# Patient Record
Sex: Male | Born: 1995 | Race: White | Hispanic: No | Marital: Married | Smoking: Never smoker
Health system: Southern US, Community
[De-identification: ages and names within clinical notes are randomized; demographics above are authoritative.]

## PROBLEM LIST (undated history)

## (undated) DIAGNOSIS — K219 Gastro-esophageal reflux disease without esophagitis: Secondary | ICD-10-CM

## (undated) HISTORY — DX: Gastro-esophageal reflux disease without esophagitis: K21.9

## (undated) HISTORY — PX: WISDOM TOOTH EXTRACTION: SHX21

---

## 2017-07-17 NOTE — Patient Instructions (Addendum)
Test(s) ordered today. Your results will be released to MyChart (or called to you) after review, usually within 72hours after test completion. If any changes need to be made, you will be notified at that same time.  All other Health Maintenance issues reviewed.   All recommended immunizations and age-appropriate screenings are up-to-date or discussed.  No immunizations administered today.   Medications reviewed and updated.  Start zantac 150 mg daily for your heartburn.     A referral was ordered for dermatology  Please followup in one year    Gastroesophageal Reflux Disease, Adult Normally, food travels down the esophagus and stays in the stomach to be digested. However, when a person has gastroesophageal reflux disease (GERD), food and stomach acid move back up into the esophagus. When this happens, the esophagus becomes sore and inflamed. Over time, GERD can create small holes (ulcers) in the lining of the esophagus. What are the causes? This condition is caused by a problem with the muscle between the esophagus and the stomach (lower esophageal sphincter, or LES). Normally, the LES muscle closes after food passes through the esophagus to the stomach. When the LES is weakened or abnormal, it does not close properly, and that allows food and stomach acid to go back up into the esophagus. The LES can be weakened by certain dietary substances, medicines, and medical conditions, including:  Tobacco use.  Pregnancy.  Having a hiatal hernia.  Heavy alcohol use.  Certain foods and beverages, such as coffee, chocolate, onions, and peppermint.  What increases the risk? This condition is more likely to develop in:  People who have an increased body weight.  People who have connective tissue disorders.  People who use NSAID medicines.  What are the signs or symptoms? Symptoms of this condition include:  Heartburn.  Difficult or painful swallowing.  The feeling of having a lump  in the throat.  Abitter taste in the mouth.  Bad breath.  Having a large amount of saliva.  Having an upset or bloated stomach.  Belching.  Chest pain.  Shortness of breath or wheezing.  Ongoing (chronic) cough or a night-time cough.  Wearing away of tooth enamel.  Weight loss.  Different conditions can cause chest pain. Make sure to see your health care provider if you experience chest pain. How is this diagnosed? Your health care provider will take a medical history and perform a physical exam. To determine if you have mild or severe GERD, your health care provider may also monitor how you respond to treatment. You may also have other tests, including:  An endoscopy toexamine your stomach and esophagus with a small camera.  A test thatmeasures the acidity level in your esophagus.  A test thatmeasures how much pressure is on your esophagus.  A barium swallow or modified barium swallow to show the shape, size, and functioning of your esophagus.  How is this treated? The goal of treatment is to help relieve your symptoms and to prevent complications. Treatment for this condition may vary depending on how severe your symptoms are. Your health care provider may recommend:  Changes to your diet.  Medicine.  Surgery.  Follow these instructions at home: Diet  Follow a diet as recommended by your health care provider. This may involve avoiding foods and drinks such as: ? Coffee and tea (with or without caffeine). ? Drinks that containalcohol. ? Energy drinks and sports drinks. ? Carbonated drinks or sodas. ? Chocolate and cocoa. ? Peppermint and mint flavorings. ? Garlic  and onions. ? Horseradish. ? Spicy and acidic foods, including peppers, chili powder, curry powder, vinegar, hot sauces, and barbecue sauce. ? Citrus fruit juices and citrus fruits, such as oranges, lemons, and limes. ? Tomato-based foods, such as red sauce, chili, salsa, and pizza with red  sauce. ? Fried and fatty foods, such as donuts, french fries, potato chips, and high-fat dressings. ? High-fat meats, such as hot dogs and fatty cuts of red and white meats, such as rib eye steak, sausage, ham, and bacon. ? High-fat dairy items, such as whole milk, butter, and cream cheese.  Eat small, frequent meals instead of large meals.  Avoid drinking large amounts of liquid with your meals.  Avoid eating meals during the 2-3 hours before bedtime.  Avoid lying down right after you eat.  Do not exercise right after you eat. General instructions  Pay attention to any changes in your symptoms.  Take over-the-counter and prescription medicines only as told by your health care provider. Do not take aspirin, ibuprofen, or other NSAIDs unless your health care provider told you to do so.  Do not use any tobacco products, including cigarettes, chewing tobacco, and e-cigarettes. If you need help quitting, ask your health care provider.  Wear loose-fitting clothing. Do not wear anything tight around your waist that causes pressure on your abdomen.  Raise (elevate) the head of your bed 6 inches (15cm).  Try to reduce your stress, such as with yoga or meditation. If you need help reducing stress, ask your health care provider.  If you are overweight, reduce your weight to an amount that is healthy for you. Ask your health care provider for guidance about a safe weight loss goal.  Keep all follow-up visits as told by your health care provider. This is important. Contact a health care provider if:  You have new symptoms.  You have unexplained weight loss.  You have difficulty swallowing, or it hurts to swallow.  You have wheezing or a persistent cough.  Your symptoms do not improve with treatment.  You have a hoarse voice. Get help right away if:  You have pain in your arms, neck, jaw, teeth, or back.  You feel sweaty, dizzy, or light-headed.  You have chest pain or shortness  of breath.  You vomit and your vomit looks like blood or coffee grounds.  You faint.  Your stool is bloody or black.  You cannot swallow, drink, or eat. This information is not intended to replace advice given to you by your health care provider. Make sure you discuss any questions you have with your health care provider. Document Released: 04/10/2005 Document Revised: 11/29/2015 Document Reviewed: 10/26/2014 Elsevier Interactive Patient Education  2018 [employer]     Health Maintenance, Male A healthy lifestyle and preventive care is important for your health and wellness. Ask your health care provider about what schedule of regular examinations is right for you. What should I know about weight and diet? Eat a Healthy Diet  Eat plenty of vegetables, fruits, whole grains, low-fat dairy products, and lean protein.  Do not eat a lot of foods high in solid fats, added sugars, or salt.  Maintain a Healthy Weight Regular exercise can help you achieve or maintain a healthy weight. You should:  Do at least 150 minutes of exercise each week. The exercise should increase your heart rate and make you sweat (moderate-intensity exercise).  Do strength-training exercises at least twice a week.  Watch Your Levels of Cholesterol and Blood  Lipids  Have your blood tested for lipids and cholesterol every 5 years starting at 22 years of age. If you are at high risk for heart disease, you should start having your blood tested when you are 22 years old. You may need to have your cholesterol levels checked more often if: ? Your lipid or cholesterol levels are high. ? You are older than 22 years of age. ? You are at high risk for heart disease.  What should I know about cancer screening? Many types of cancers can be detected early and may often be prevented. Lung Cancer  You should be screened every year for lung cancer if: ? You are a current smoker who has smoked for at least 30  years. ? You are a former smoker who has quit within the past 15 years.  Talk to your health care provider about your screening options, when you should start screening, and how often you should be screened.  Colorectal Cancer  Routine colorectal cancer screening usually begins at 22 years of age and should be repeated every 5-10 years until you are 22 years old. You may need to be screened more often if early forms of precancerous polyps or small growths are found. Your health care provider may recommend screening at an earlier age if you have risk factors for colon cancer.  Your health care provider may recommend using home test kits to check for hidden blood in the stool.  A small camera at the end of a tube can be used to examine your colon (sigmoidoscopy or colonoscopy). This checks for the earliest forms of colorectal cancer.  Prostate and Testicular Cancer  Depending on your age and overall health, your health care provider may do certain tests to screen for prostate and testicular cancer.  Talk to your health care provider about any symptoms or concerns you have about testicular or prostate cancer.  Skin Cancer  Check your skin from head to toe regularly.  Tell your health care provider about any new moles or changes in moles, especially if: ? There is a change in a mole's size, shape, or color. ? You have a mole that is larger than a pencil eraser.  Always use sunscreen. Apply sunscreen liberally and repeat throughout the day.  Protect yourself by wearing long sleeves, pants, a wide-brimmed hat, and sunglasses when outside.  What should I know about heart disease, diabetes, and high blood pressure?  If you are 68-4 years of age, have your blood pressure checked every 3-5 years. If you are 62 years of age or older, have your blood pressure checked every year. You should have your blood pressure measured twice-once when you are at a hospital or clinic, and once when you are  not at a hospital or clinic. Record the average of the two measurements. To check your blood pressure when you are not at a hospital or clinic, you can use: ? An automated blood pressure machine at a pharmacy. ? A home blood pressure monitor.  Talk to your health care provider about your target blood pressure.  If you are between 105-75 years old, ask your health care provider if you should take aspirin to prevent heart disease.  Have regular diabetes screenings by checking your fasting blood sugar level. ? If you are at a normal weight and have a low risk for diabetes, have this test once every three years after the age of 35. ? If you are overweight and have a high risk  for diabetes, consider being tested at a younger age or more often.  A one-time screening for abdominal aortic aneurysm (AAA) by ultrasound is recommended for men aged 65-75 years who are current or former smokers. What should I know about preventing infection? Hepatitis B If you have a higher risk for hepatitis B, you should be screened for this virus. Talk with your health care provider to find out if you are at risk for hepatitis B infection. Hepatitis C Blood testing is recommended for:  Everyone born from [birth year] through 1965.  Anyone with known risk factors for hepatitis C.  Sexually Transmitted Diseases (STDs)  You should be screened each year for STDs including gonorrhea and chlamydia if: ? You are sexually active and are younger than 22 years of age. ? You are older than 22 years of age and your health care provider tells you that you are at risk for this type of infection. ? Your sexual activity has changed since you were last screened and you are at an increased risk for chlamydia or gonorrhea. Ask your health care provider if you are at risk.  Talk with your health care provider about whether you are at high risk of being infected with HIV. Your health care provider may recommend a prescription medicine to help  prevent HIV infection.  What else can I do?  Schedule regular health, dental, and eye exams.  Stay current with your vaccines (immunizations).  Do not use any tobacco products, such as cigarettes, chewing tobacco, and e-cigarettes. If you need help quitting, ask your health care provider.  Limit alcohol intake to no more than 2 drinks per day. One drink equals 12 ounces of beer, 5 ounces of wine, or 1 ounces of hard liquor.  Do not use street drugs.  Do not share needles.  Ask your health care provider for help if you need support or information about quitting drugs.  Tell your health care provider if you often feel depressed.  Tell your health care provider if you have ever been abused or do not feel safe at home. This information is not intended to replace advice given to you by your health care provider. Make sure you discuss any questions you have with your health care provider. Document Released: 12/28/2007 Document Revised: 02/28/2016 Document Reviewed: 04/04/2015 Elsevier Interactive Patient Education  [employer].

## 2017-07-17 NOTE — Progress Notes (Signed)
Subjective:    Patient ID: Aaron Haynes, male    DOB: 1996-05-24, 22 y.o.   MRN: [id number]  HPI  He is here to establish with a new pcp.  He is here for a physical exam.   GERD:  He has GERD 2-3 times a week.  He takes tums as needed and it works.  He tried to avoid spicy/greasy foods, but eats it sometimes.  He was prescribed medication at one point, but states he never took it.    IBS:  He has sharp abdominal pain at times.  He has increased gas.  He has problems  1-2 times a week.  He had constipation in the past, but now feel his stools are normal.  He states his bowel movements are normal.  He denies any blood in the stool.  Follow-up annually, sooner  Fatigue: his sleep is awful.  He takes metlatonin on occasion.  He is a light sleep.  He wakes a couple of times a night.  He always wakes up about 8 am.  He gets about 7-8 hours a night.  He feels tired when he wakes up.  He feels tired during the day at times sometimes.  He can not and does not take naps.    Medications and allergies reviewed with patient and updated if appropriate.  Patient Active Problem List   Diagnosis Date Noted  . Fatigue 07/18/2017  . IBS (irritable bowel syndrome) 07/18/2017  . GERD (gastroesophageal reflux disease) 07/18/2017  . Pleurisy 07/18/2017  . Plantar wart of right foot 07/18/2017    No current outpatient medications on file prior to visit.   No current facility-administered medications on file prior to visit.     Past Medical History:  Diagnosis Date  . GERD (gastroesophageal reflux disease)     History reviewed. No pertinent surgical history.  Social History   Socioeconomic History  . Marital status: Single    Spouse name: None  . Number of children: None  . Years of education: None  . Highest education level: None  Social Needs  . Financial resource strain: None  . Food insecurity - worry: None  . Food insecurity - inability: None  . Transportation needs - medical:  None  . Transportation needs - non-medical: None  Occupational History  . None  Tobacco Use  . Smoking status: Never Smoker  . Smokeless tobacco: Never Used  Substance and Sexual Activity  . Alcohol use: No    Frequency: Never  . Drug use: No  . Sexual activity: None  Other Topics Concern  . 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 Hypertension Father   . Hyperlipidemia Father   . Heart disease Maternal Grandmother   . Heart disease Maternal Grandfather        heart attack late 78-50's  . Heart disease Paternal Grandfather        heart attack late 40-50's    Review of Systems  Constitutional: Positive for fatigue. Negative for chills and fever.  Eyes: Negative for visual disturbance.  Respiratory: Negative for cough, shortness of breath and wheezing.   Cardiovascular: Negative for chest pain, palpitations and leg swelling.  Gastrointestinal: Positive for abdominal pain (occ with IBS). Negative for blood in stool, constipation and diarrhea.       Gerd  Genitourinary: Negative  for dysuria, frequency and hematuria.  Musculoskeletal: Positive for arthralgias (knee pain) and back pain (occ lower back pain).  Skin: Negative for color change (possible wart right plantar foot) and rash.  Neurological: Positive for headaches (occasional). Negative for dizziness and light-headedness.  Psychiatric/Behavioral: Negative for dysphoric mood. The patient is not nervous/anxious.        Objective:   Vitals:   07/18/17 1454  BP: 104/78  Pulse: 64  Resp: 16  Temp: 98.7 F (37.1 C)  SpO2: 97%   Filed Weights   07/18/17 1454  Weight: 149 lb (67.6 kg)   Body mass index is 21.38 kg/m.  Wt Readings from Last 3 Encounters:  07/18/17 149 lb (67.6 kg)     Physical Exam Constitutional: He appears well-developed and well-nourished. No distress.  HENT:  Head:  Normocephalic and atraumatic.  Right Ear: External ear normal.  Left Ear: External ear normal.  Mouth/Throat: Oropharynx is clear and moist.  Normal ear canals and TM b/l  Eyes: Conjunctivae and EOM are normal.  Neck: Neck supple. No tracheal deviation present. No thyromegaly present.  No carotid bruit  Cardiovascular: Normal rate, regular rhythm, normal heart sounds and intact distal pulses.   No murmur heard. Pulmonary/Chest: Effort normal and breath sounds normal. No respiratory distress. He has no wheezes. He has no rales.  Abdominal: Soft. He exhibits no distension. There is no tenderness.  Genitourinary: deferred  Musculoskeletal: He exhibits no edema.  Lymphadenopathy:   He has no cervical adenopathy.  Skin: Skin is warm and dry. He is not diaphoretic. Warts bottom of foot Psychiatric: He has a normal mood and affect. His behavior is normal.         Assessment & Plan:   Physical exam: Screening blood work  ordered Immunizations   Up to date  Exercise   Stressed regular exercise Weight  Normal BMI Skin   Warts on bottom of foot  - referred to derm Substance abuse   none  See Problem List for Assessment and Plan of chronic medical problems.    Follow-up annually, sooner if heartburn is not better controlled

## 2017-07-18 ENCOUNTER — Ambulatory Visit: Payer: 59 | Admitting: Internal Medicine

## 2017-07-18 ENCOUNTER — Encounter: Payer: Self-pay | Admitting: Internal Medicine

## 2017-07-18 ENCOUNTER — Other Ambulatory Visit (INDEPENDENT_AMBULATORY_CARE_PROVIDER_SITE_OTHER): Payer: 59

## 2017-07-18 VITALS — BP 104/78 | HR 64 | Temp 98.7°F | Resp 16 | Ht 70.0 in | Wt 149.0 lb

## 2017-07-18 DIAGNOSIS — Z0000 Encounter for general adult medical examination without abnormal findings: Secondary | ICD-10-CM

## 2017-07-18 DIAGNOSIS — Z114 Encounter for screening for human immunodeficiency virus [HIV]: Secondary | ICD-10-CM

## 2017-07-18 DIAGNOSIS — Z0001 Encounter for general adult medical examination with abnormal findings: Secondary | ICD-10-CM

## 2017-07-18 DIAGNOSIS — K219 Gastro-esophageal reflux disease without esophagitis: Secondary | ICD-10-CM | POA: Diagnosis not present

## 2017-07-18 DIAGNOSIS — B070 Plantar wart: Secondary | ICD-10-CM

## 2017-07-18 DIAGNOSIS — K589 Irritable bowel syndrome without diarrhea: Secondary | ICD-10-CM | POA: Insufficient documentation

## 2017-07-18 DIAGNOSIS — R5383 Other fatigue: Secondary | ICD-10-CM

## 2017-07-18 DIAGNOSIS — R091 Pleurisy: Secondary | ICD-10-CM | POA: Diagnosis not present

## 2017-07-18 LAB — CBC WITH DIFFERENTIAL/PLATELET
BASOS PCT: 0.4 % (ref 0.0–3.0)
Basophils Absolute: 0 10*3/uL (ref 0.0–0.1)
EOS ABS: 0.3 10*3/uL (ref 0.0–0.7)
EOS PCT: 6 % — AB (ref 0.0–5.0)
HCT: 45.4 % (ref 39.0–52.0)
Hemoglobin: 15.7 g/dL (ref 13.0–17.0)
LYMPHS ABS: 1.5 10*3/uL (ref 0.7–4.0)
Lymphocytes Relative: 29.8 % (ref 12.0–46.0)
MCHC: 34.6 g/dL (ref 30.0–36.0)
MCV: 88.9 fl (ref 78.0–100.0)
MONO ABS: 0.6 10*3/uL (ref 0.1–1.0)
Monocytes Relative: 11.3 % (ref 3.0–12.0)
NEUTROS ABS: 2.6 10*3/uL (ref 1.4–7.7)
Neutrophils Relative %: 52.5 % (ref 43.0–77.0)
PLATELETS: 227 10*3/uL (ref 150.0–400.0)
RBC: 5.1 Mil/uL (ref 4.22–5.81)
RDW: 12.7 % (ref 11.5–15.5)
WBC: 5 10*3/uL (ref 4.0–10.5)

## 2017-07-18 LAB — COMPREHENSIVE METABOLIC PANEL
ALT: 25 U/L (ref 0–53)
AST: 18 U/L (ref 0–37)
Albumin: 4.9 g/dL (ref 3.5–5.2)
Alkaline Phosphatase: 48 U/L (ref 39–117)
BUN: 12 mg/dL (ref 6–23)
CHLORIDE: 103 meq/L (ref 96–112)
CO2: 30 meq/L (ref 19–32)
CREATININE: 0.95 mg/dL (ref 0.40–1.50)
Calcium: 9.3 mg/dL (ref 8.4–10.5)
GFR: 106.33 mL/min (ref >60.00)
GLUCOSE: 91 mg/dL (ref 70–99)
POTASSIUM: 4.7 meq/L (ref 3.5–5.1)
SODIUM: 139 meq/L (ref 135–145)
Total Bilirubin: 2.2 mg/dL — ABNORMAL HIGH (ref 0.2–1.2)
Total Protein: 6.9 g/dL (ref 6.0–8.3)

## 2017-07-18 LAB — LIPID PANEL
CHOL/HDL RATIO: 4
Cholesterol: 161 mg/dL (ref 0–200)
HDL: 43.5 mg/dL (ref >39.00)
LDL CALC: 103 mg/dL — AB (ref 0–99)
NonHDL: 117.77
TRIGLYCERIDES: 75 mg/dL (ref 0.0–149.0)
VLDL: 15 mg/dL (ref 0.0–40.0)

## 2017-07-18 LAB — TSH: TSH: 2.77 u[IU]/mL (ref 0.35–4.50)

## 2017-07-18 MED ORDER — RANITIDINE HCL 150 MG PO TABS: 150.0000 mg | ORAL_TABLET | Freq: Every day | ORAL | 3 refills | Status: DC

## 2017-07-18 MED ORDER — CALCIUM CARBONATE ANTACID 500 MG PO CHEW: 1.0000 | CHEWABLE_TABLET | Freq: Every day | ORAL | Status: AC | PRN

## 2017-07-19 ENCOUNTER — Encounter: Payer: Self-pay | Admitting: Internal Medicine

## 2017-07-19 LAB — HIV ANTIBODY (ROUTINE TESTING W REFLEX): HIV 1&2 Ab, 4th Generation: NONREACTIVE

## 2017-07-19 NOTE — Assessment & Plan Note (Signed)
No active symptoms-this is something he has experienced in the past.  No symptoms for 2 years or so.

## 2017-07-19 NOTE — Assessment & Plan Note (Signed)
Experiencing GERD 2-3 times a week Taking Tums We will start ranitidine nightly-advised ideally once heartburn is well controlled to stop taking this Decrease Tea, carbonated beverages, caffeine Information on GERD diet given If no improvement consider GI referral given age and duration of GERD

## 2017-07-19 NOTE — Assessment & Plan Note (Signed)
We will check basic blood work His sleep quality is not good, but still getting enough sleep Stressed the importance of regular exercise Work on improving sleep quality

## 2017-07-19 NOTE — Assessment & Plan Note (Signed)
Has several on bottom of right foot Will refer to dermatology

## 2017-07-19 NOTE — Assessment & Plan Note (Signed)
Intermittent abdominal pain related to gas No alternating diarrhea or constipation-bowel movements fairly normal No treatment or further evaluation needed

## 2018-07-27 ENCOUNTER — Encounter: Payer: 59 | Admitting: Internal Medicine

## 2018-07-28 NOTE — Patient Instructions (Addendum)
All other Health Maintenance issues reviewed.   All recommended immunizations and age-appropriate screenings are up-to-date or discussed.  No immunizations administered today.   Medications reviewed and updated.  Changes include :   none  Your prescription(s) have been submitted to your pharmacy. Please take as directed and contact our office if you believe you are having problem(s) with the medication(s).  A referral was ordered for GI  Please followup in one year    Health Maintenance, Male A healthy lifestyle and preventive care is important for your health and wellness. Ask your health care provider about what schedule of regular examinations is right for you. What should I know about weight and diet? Eat a Healthy Diet  Eat plenty of vegetables, fruits, whole grains, low-fat dairy products, and lean protein.  Do not eat a lot of foods high in solid fats, added sugars, or salt.  Maintain a Healthy Weight Regular exercise can help you achieve or maintain a healthy weight. You should:  Do at least 150 minutes of exercise each week. The exercise should increase your heart rate and make you sweat (moderate-intensity exercise).  Do strength-training exercises at least twice a week. Watch Your Levels of Cholesterol and Blood Lipids  Have your blood tested for lipids and cholesterol every 5 years starting at 23 years of age. If you are at high risk for heart disease, you should start having your blood tested when you are 23 years old. You may need to have your cholesterol levels checked more often if: ? Your lipid or cholesterol levels are high. ? You are older than 23 years of age. ? You are at high risk for heart disease. What should I know about cancer screening? Many types of cancers can be detected early and may often be prevented. Lung Cancer  You should be screened every year for lung cancer if: ? You are a current smoker who has smoked for at least 30 years. ? You are  a former smoker who has quit within the past 15 years.  Talk to your health care provider about your screening options, when you should start screening, and how often you should be screened. Colorectal Cancer  Routine colorectal cancer screening usually begins at 23 years of age and should be repeated every 5-10 years until you are 23 years old. You may need to be screened more often if early forms of precancerous polyps or small growths are found. Your health care provider may recommend screening at an earlier age if you have risk factors for colon cancer.  Your health care provider may recommend using home test kits to check for hidden blood in the stool.  A small camera at the end of a tube can be used to examine your colon (sigmoidoscopy or colonoscopy). This checks for the earliest forms of colorectal cancer. Prostate and Testicular Cancer  Depending on your age and overall health, your health care provider may do certain tests to screen for prostate and testicular cancer.  Talk to your health care provider about any symptoms or concerns you have about testicular or prostate cancer. Skin Cancer  Check your skin from head to toe regularly.  Tell your health care provider about any new moles or changes in moles, especially if: ? There is a change in a mole's size, shape, or color. ? You have a mole that is larger than a pencil eraser.  Always use sunscreen. Apply sunscreen liberally and repeat throughout the day.  Protect yourself by wearing  long sleeves, pants, a wide-brimmed hat, and sunglasses when outside. What should I know about heart disease, diabetes, and high blood pressure?  If you are 67-83 years of age, have your blood pressure checked every 3-5 years. If you are 32 years of age or older, have your blood pressure checked every year. You should have your blood pressure measured twice-once when you are at a hospital or clinic, and once when you are not at a hospital or  clinic. Record the average of the two measurements. To check your blood pressure when you are not at a hospital or clinic, you can use: ? An automated blood pressure machine at a pharmacy. ? A home blood pressure monitor.  Talk to your health care provider about your target blood pressure.  If you are between 3-48 years old, ask your health care provider if you should take aspirin to prevent heart disease.  Have regular diabetes screenings by checking your fasting blood sugar level. ? If you are at a normal weight and have a low risk for diabetes, have this test once every three years after the age of 77. ? If you are overweight and have a high risk for diabetes, consider being tested at a younger age or more often.  A one-time screening for abdominal aortic aneurysm (AAA) by ultrasound is recommended for men aged 65-75 years who are current or former smokers. What should I know about preventing infection? Hepatitis B If you have a higher risk for hepatitis B, you should be screened for this virus. Talk with your health care provider to find out if you are at risk for hepatitis B infection. Hepatitis C Blood testing is recommended for:  Everyone born from [birth year] through 1965.  Anyone with known risk factors for hepatitis C. Sexually Transmitted Diseases (STDs)  You should be screened each year for STDs including gonorrhea and chlamydia if: ? You are sexually active and are younger than 22 years of age. ? You are older than 23 years of age and your health care provider tells you that you are at risk for this type of infection. ? Your sexual activity has changed since you were last screened and you are at an increased risk for chlamydia or gonorrhea. Ask your health care provider if you are at risk.  Talk with your health care provider about whether you are at high risk of being infected with HIV. Your health care provider may recommend a prescription medicine to help prevent HIV  infection. What else can I do?  Schedule regular health, dental, and eye exams.  Stay current with your vaccines (immunizations).  Do not use any tobacco products, such as cigarettes, chewing tobacco, and e-cigarettes. If you need help quitting, ask your health care provider.  Limit alcohol intake to no more than 2 drinks per day. One drink equals 12 ounces of beer, 5 ounces of wine, or 1 ounces of hard liquor.  Do not use street drugs.  Do not share needles.  Ask your health care provider for help if you need support or information about quitting drugs.  Tell your health care provider if you often feel depressed.  Tell your health care provider if you have ever been abused or do not feel safe at home. This information is not intended to replace advice given to you by your health care provider. Make sure you discuss any questions you have with your health care provider. Document Released: 12/28/2007 Document Revised: 02/28/2016 Document Reviewed: 04/04/2015 Elsevier Interactive  Patient Education  2019 [employer].

## 2018-07-28 NOTE — Progress Notes (Signed)
Subjective:    Patient ID: Aaron Haynes, male    DOB: [birth year], 23 y.o.   MRN: [id number]  HPI He is here for a physical exam.   He is more nauseous with and without eating.  He sometimes wakes up feeling nauseous.  He feels nauseous most days of the week, especially recently.  He has not vomited.  He denies abdominal pain or reflux with the nausea.  He does have reflux sometimes, but that is better.  When nauseous he eats crackers and sometimes it helps and sometimes it makes it worse.  He denies obvious causes. He has occasional painful dysphagia ( more than once a month) and often has to throw up.    He denies changes in his health except the above.    Medications and allergies reviewed with patient and updated if appropriate.  Patient Active Problem List   Diagnosis Date Noted  . Fatigue 07/18/2017  . IBS (irritable bowel syndrome) 07/18/2017  . GERD (gastroesophageal reflux disease) 07/18/2017  . Pleurisy 07/18/2017  . Plantar wart of right foot 07/18/2017    Current Outpatient Medications on File Prior to Visit  Medication Sig Dispense Refill  . calcium carbonate (TUMS) 500 MG chewable tablet Chew 1 tablet (200 mg of elemental calcium total) by mouth daily as needed for indigestion or heartburn.     No current facility-administered medications on file prior to visit.     Past Medical History:  Diagnosis Date  . GERD (gastroesophageal reflux disease)     No past surgical history on file.  Social History   Socioeconomic History  . Marital status: Single    Spouse name: Not on file  . Number of children: Not on file  . Years of education: Not on file  . Highest education level: Not on file  Occupational History  . Not on file  Social Needs  . Financial resource strain: Not on file  . Food insecurity:    Worry: Not on file    Inability: Not on file  . Transportation needs:    Medical: Not on file    Non-medical: Not on file  Tobacco Use  . Smoking  status: Never Smoker  . Smokeless tobacco: Never Used  Substance and Sexual Activity  . Alcohol use: No    Frequency: Never  . Drug use: No  . Sexual activity: Not on file  Lifestyle  . Physical activity:    Days per week: Not on file    Minutes per session: Not on file  . Stress: Not on file  Relationships  . Social connections:    Talks on phone: Not on file    Gets together: Not on file    Attends religious service: Not on file    Active member of club or organization: Not on file    Attends meetings of clubs or organizations: Not on file    Relationship status: Not on file  Other Topics Concern  . Not on file  Social History Narrative   College   Part time bank teller   Exercises 2 times a week on average    Family History  Problem Relation Age of Onset  . Hypertension Father   . Hyperlipidemia Father   . Heart disease Maternal Grandmother   . Heart disease Maternal Grandfather        heart attack late 63-50's  . Heart disease Paternal Grandfather        heart attack late 68-50's  Review of Systems  Constitutional: Negative for chills and fever.  HENT: Positive for trouble swallowing (occ).   Eyes: Negative for visual disturbance.  Respiratory: Negative for cough, shortness of breath and wheezing.   Cardiovascular: Negative for chest pain, palpitations and leg swelling.  Gastrointestinal: Positive for nausea. Negative for abdominal pain, blood in stool, constipation, diarrhea and vomiting.       Occ GERD  Genitourinary: Negative for dysuria and hematuria.  Musculoskeletal: Positive for arthralgias (from sports).  Skin: Negative for rash.  Neurological: Positive for light-headedness (if stands up too quick).  Psychiatric/Behavioral: Negative for dysphoric mood. The patient is not nervous/anxious.        Objective:   Vitals:   07/29/18 0900  BP: 118/78  Pulse: 81  Resp: 16  Temp: 98.2 F (36.8 C)  SpO2: 98%   Filed Weights   07/29/18 0900    Weight: 145 lb 12.8 oz (66.1 kg)   Body mass index is 20.92 kg/m.  Wt Readings from Last 3 Encounters:  07/29/18 145 lb 12.8 oz (66.1 kg)  07/18/17 149 lb (67.6 kg)     Physical Exam Constitutional: He appears well-developed and well-nourished. No distress.  HENT:  Head: Normocephalic and atraumatic.  Right Ear: External ear normal.  Left Ear: External ear normal.  Mouth/Throat: Oropharynx is clear and moist.  Normal ear canals and TM b/l  Eyes: Conjunctivae and EOM are normal.  Neck: Neck supple. No tracheal deviation present. No thyromegaly present.  No carotid bruit  Cardiovascular: Normal rate, regular rhythm, normal heart sounds and intact distal pulses.   No murmur heard. Pulmonary/Chest: Effort normal and breath sounds normal. No respiratory distress. He has no wheezes. He has no rales.  Abdominal: Soft. He exhibits no distension. There is no tenderness.  Genitourinary: deferred  Musculoskeletal: He exhibits no edema.  Lymphadenopathy:   He has no cervical adenopathy.  Skin: Skin is warm and dry. He is not diaphoretic.  Psychiatric: He has a normal mood and affect. His behavior is normal.         Assessment & Plan:   Physical exam: Screening blood work deferred - blood work last year was very good Immunizations     Deferred flu,  tdap up to date Exercise  Active at work Weight   Normal BMI Skin   No concerns Substance abuse   none  See Problem List for Assessment and Plan of chronic medical problems.   FU in one year

## 2018-07-29 ENCOUNTER — Encounter: Payer: Self-pay | Admitting: Gastroenterology

## 2018-07-29 ENCOUNTER — Encounter: Payer: Self-pay | Admitting: Internal Medicine

## 2018-07-29 ENCOUNTER — Ambulatory Visit (INDEPENDENT_AMBULATORY_CARE_PROVIDER_SITE_OTHER): Payer: 59 | Admitting: Internal Medicine

## 2018-07-29 VITALS — BP 118/78 | HR 81 | Temp 98.2°F | Resp 16 | Ht 70.0 in | Wt 145.8 lb

## 2018-07-29 DIAGNOSIS — K219 Gastro-esophageal reflux disease without esophagitis: Secondary | ICD-10-CM

## 2018-07-29 DIAGNOSIS — R110 Nausea: Secondary | ICD-10-CM | POA: Diagnosis not present

## 2018-07-29 DIAGNOSIS — Z0000 Encounter for general adult medical examination without abnormal findings: Secondary | ICD-10-CM

## 2018-07-29 DIAGNOSIS — R1310 Dysphagia, unspecified: Secondary | ICD-10-CM | POA: Diagnosis not present

## 2018-07-29 NOTE — Assessment & Plan Note (Signed)
Frequent nausea and intermittent dysphagia - referred to GI Has h/o GERD which he feels is controlled Nausea and dysphagia very possibly from GERD Deferred any medication  Will see what GI thinks

## 2018-07-29 NOTE — Assessment & Plan Note (Signed)
Referred to GI

## 2018-07-29 NOTE — Assessment & Plan Note (Signed)
Feels his gerd is controlled and not related to his nausea, but with nausea and dysphagia GERD is a likely cause Deferred medication - prescribed zantac last year and it did help GERD but he did not take it daily and then he states it made gerd worse Will see what GI says

## 2018-08-26 ENCOUNTER — Ambulatory Visit: Payer: 59 | Admitting: Gastroenterology

## 2018-08-26 ENCOUNTER — Encounter: Payer: Self-pay | Admitting: Gastroenterology

## 2018-08-26 VITALS — BP 92/62 | HR 64 | Ht 69.5 in | Wt 142.0 lb

## 2018-08-26 DIAGNOSIS — R1310 Dysphagia, unspecified: Secondary | ICD-10-CM

## 2018-08-26 DIAGNOSIS — K219 Gastro-esophageal reflux disease without esophagitis: Secondary | ICD-10-CM

## 2018-08-26 MED ORDER — OMEPRAZOLE 40 MG PO CPDR: 40.0000 mg | DELAYED_RELEASE_CAPSULE | Freq: Every day | ORAL | 4 refills | Status: DC

## 2018-08-26 NOTE — Progress Notes (Signed)
Referring Provider: Pincus SanesBurns, Stacy J, MD Primary Care Physician:  Pincus SanesBurns, Stacy J, MD   Reason for Consultation:  GERD, dysphagia   IMPRESSION:  Reflux x many years Dysphagia becoming progressively worse Nausea x weeks to months Ibuprofen use for arthritic pain  All of his symptoms may be due to incompletely treated reflux with an associated GERD related dysmotility.  However, given his age and his symptoms of dysphasia eosinophilic esophagitis should be excluded.  The differential also includes peptic ulcer disease, H. pylori, gastritis, and given the chronicity, functional etiologies must also be considered.  Given this differential I am recommending a trial of daily proton pump inhibitor therapy.  Hopefully this will provide improved symptom relief beyond the Zantac PRN.  I am also recommending an upper endoscopy with esophageal biopsies to exclude eosinophilic esophagitis.  PLAN: Omeprazole 40 mg daily Reflux lifestyle brochure provided Continue to use Tums PRN EGD with esophageal biopsies  I consented the patient at the bedside today discussing the risks, benefits, and alternatives to endoscopic evaluation. In particular, we discussed the risks that include, but are not limited to, reaction to medication, cardiopulmonary compromise, bleeding requiring blood transfusion, aspiration resulting in pneumonia, perforation requiring surgery, lack of diagnosis, severe illness requiring hospitalization, and even death. We reviewed the risk of missed lesion including polyps or even cancer. The patient acknowledges these risks and asks that we proceed.   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 Lawerance BachBurns for further evaluation of GERD, occasional dysphasia, and frequent nausea.  History is obtained through the patient and review of his electronic health record.  Reflux since middle school. Exacerbated by spicy foods and carbonated  and caffeinated beverages. Reviewed refux with Dr. Lawerance BachBurns last year. Trial of Zantac controlled the symptoms at that time. But, when he forgot to take the medication, which was frequently, the symptoms were worse. He felt better not taking Zantac and has been using Tums for control since that time. Currently needs Tums at least weekly.   Intermittent dysphagia with food lodging that is localized to the xiphoid process has been occurring over the last 3-4 years. Associated odynophagia. Has required induced regurgitation. Happens at least once or twice weekly.  Concerned that the symptoms are worsening.  No dysphonia, no sore throat, no abdominal pain.  Weight is stable.  No other associated symptoms. No identified exacerbating or relieving features.   Finds that he gets "too sick on his stomach too easy." Feels some nausea when he hasn't eaten. However, with eating the nausea gets worse.  Occasionally wakes up nauseated. No associated vomiting. No abdominal pain.  Some improvement over the last couple of weeks. He feels his reflux is controlled and not related to his nausea.   Uses ibuprofen. Recently used ibuprofen for pharyngitis. Will occasionally use it for back pain related to sports injuries from high school. No other NSAIDs.   Sister has ulcerative colitis. Multiple family members with GI problems. Mother with stomach problems and swallowing difficulties.   Past Medical History:  Diagnosis Date  . GERD (gastroesophageal reflux disease)     Past Surgical History:  Procedure Laterality Date  . WISDOM TOOTH EXTRACTION      Current Outpatient Medications  Medication Sig Dispense Refill  . calcium carbonate (TUMS) 500 MG chewable tablet Chew 1 tablet (200 mg of elemental calcium total) by mouth daily as needed for indigestion or heartburn.    . clindamycin (CLINDAGEL) 1 %  gel Apply 1 application topically daily.     No current facility-administered medications for this visit.      Allergies as of 08/26/2018  . (No Known Allergies)    Family History  Problem Relation Age of Onset  . Hypertension Father   . Hyperlipidemia Father   . Heart disease Maternal Grandmother   . Diabetes Maternal Grandmother   . Heart disease Maternal Grandfather        heart attack late 1840-50's  . Heart disease Paternal Grandfather        heart attack late 2540-50's  . Irritable bowel syndrome Mother   . Ulcerative colitis Sister     Social History   Socioeconomic History  . Marital status: Single    Spouse name: Not on file  . Number of children: 0  . Years of education: Not on file  . Highest education level: Not on file  Occupational History  . Occupation: church  Social Needs  . Financial resource strain: Not on file  . Food insecurity:    Worry: Not on file    Inability: Not on file  . Transportation needs:    Medical: Not on file    Non-medical: Not on file  Tobacco Use  . Smoking status: Never Smoker  . Smokeless tobacco: Never Used  Substance and Sexual Activity  . Alcohol use: No    Frequency: Never  . Drug use: No  . Sexual activity: Not on file  Lifestyle  . Physical activity:    Days per week: Not on file    Minutes per session: Not on file  . Stress: Not on file  Relationships  . Social connections:    Talks on phone: Not on file    Gets together: Not on file    Attends religious service: Not on file    Active member of club or organization: Not on file    Attends meetings of clubs or organizations: Not on file    Relationship status: Not on file  . Intimate partner violence:    Fear of current or ex partner: Not on file    Emotionally abused: Not on file    Physically abused: Not on file    Forced sexual activity: Not on file  Other Topics Concern  . Not on file  Social History Narrative   College   Part time bank teller   Exercises 2 times a week on average    Review of Systems: 12 system ROS is negative except as noted above  except for sports related arthralgias and lightheadedness if he stands up too quickly.  Filed Weights   08/26/18 0904  Weight: 142 lb (64.4 kg)    Physical Exam: Vital signs were reviewed. General:   Alert, well-nourished, pleasant and cooperative in NAD Head:  Normocephalic and atraumatic. Eyes:  Sclera clear, no icterus.   Conjunctiva pink. Mouth:  No deformity or lesions.   Neck:  Supple; no thyromegaly. Lungs:  Clear throughout to auscultation.   No wheezes. Heart:  Regular rate and rhythm; no murmurs Abdomen:  Soft, thin, nontender, normal bowel sounds. No rebound or guarding. No hepatosplenomegaly LAD: No inguinal or umbilical lymphadenopathy Rectal:  Deferred  Msk:  Symmetrical without gross deformities. Extremities:  No gross deformities or edema. Neurologic:  Alert and  oriented x4;  grossly nonfocal Skin:  No rash or bruise. Psych:  Alert and cooperative. Normal mood and affect.   Shaneya Taketa L. Orvan FalconerBeavers, MD, MPH [facility] 08/26/2018, 9:27 AM

## 2018-08-26 NOTE — Patient Instructions (Addendum)
If you are age 23 or older, your body mass index should be between 23-30. Your Body mass index is 20.67 kg/m. If this is out of the aforementioned range listed, please consider follow up with your Primary Care Provider.  If you are age 17 or younger, your body mass index should be between 19-25. Your Body mass index is 20.67 kg/m. If this is out of the aformentioned range listed, please consider follow up with your Primary Care Provider.   You have been scheduled for an endoscopy. Please follow written instructions given to you at your visit today. If you use inhalers (even only as needed), please bring them with you on the day of your procedure. Your physician has requested that you go to www.startemmi.com and enter the access code given to you at your visit today. This web site gives a general overview about your procedure. However, you should still follow specific instructions given to you by our office regarding your preparation for the procedure.  We have sent the following medications to your pharmacy for you to pick up at your convenience: Omeprazole 40mg  Take once daily  Continue to use TUMS as needed.  We are giving you a handout regarding Reflux today.    Thank you for entrusting me with your care and for choosing [facility], Dr. Tressia Danas

## 2018-08-27 ENCOUNTER — Encounter: Payer: Self-pay | Admitting: Gastroenterology

## 2018-08-27 ENCOUNTER — Ambulatory Visit (AMBULATORY_SURGERY_CENTER): Payer: 59 | Admitting: Gastroenterology

## 2018-08-27 VITALS — BP 120/88 | HR 61 | Temp 99.1°F | Resp 12 | Ht 69.0 in | Wt 142.0 lb

## 2018-08-27 DIAGNOSIS — K219 Gastro-esophageal reflux disease without esophagitis: Secondary | ICD-10-CM

## 2018-08-27 DIAGNOSIS — K200 Eosinophilic esophagitis: Secondary | ICD-10-CM

## 2018-08-27 DIAGNOSIS — K2970 Gastritis, unspecified, without bleeding: Secondary | ICD-10-CM | POA: Diagnosis not present

## 2018-08-27 MED ORDER — SODIUM CHLORIDE 0.9 % IV SOLN: 500.0000 mL | Freq: Once | INTRAVENOUS | Status: DC

## 2018-08-27 NOTE — Progress Notes (Signed)
PT taken to PACU. Monitors in place. VSS. Report given to RN. 

## 2018-08-27 NOTE — Progress Notes (Signed)
Called to room to assist during endoscopic procedure.  Patient ID and intended procedure confirmed with present staff. Received instructions for my participation in the procedure from the performing physician.  

## 2018-08-27 NOTE — Patient Instructions (Signed)
YOU HAD AN ENDOSCOPIC PROCEDURE TODAY AT THE [facility]:   Refer to the procedure report that was given to you for any specific questions about what was found during the examination.  If the procedure report does not answer your questions, please call your gastroenterologist to clarify.  If you requested that your care partner not be given the details of your procedure findings, then the procedure report has been included in a sealed envelope for you to review at your convenience later.  YOU SHOULD EXPECT: Some feelings of bloating in the abdomen. Passage of more gas than usual.  Walking can help get rid of the air that was put into your GI tract during the procedure and reduce the bloating. If you had a lower endoscopy (such as a colonoscopy or flexible sigmoidoscopy) you may notice spotting of blood in your stool or on the toilet paper. If you underwent a bowel prep for your procedure, you may not have a normal bowel movement for a few days.  Please Note:  You might notice some irritation and congestion in your nose or some drainage.  This is from the oxygen used during your procedure.  There is no need for concern and it should clear up in a day or so.  SYMPTOMS TO REPORT IMMEDIATELY:   Following upper endoscopy (EGD)  Vomiting of blood or coffee ground material  New chest pain or pain under the shoulder blades  Painful or persistently difficult swallowing  New shortness of breath  Fever of 100F or higher  Black, tarry-looking stools  For urgent or emergent issues, a gastroenterologist can be reached at any hour by calling [phone].   DIET:  We do recommend a small meal at first, but then you may proceed to your regular diet.  Drink plenty of fluids but you should avoid alcoholic beverages for 24 hours.  ACTIVITY:  You should plan to take it easy for the rest of today and you should NOT DRIVE or use heavy machinery until tomorrow (because of the sedation medicines used  during the test).    FOLLOW UP: Our staff will call the number listed on your records the next business day following your procedure to check on you and address any questions or concerns that you may have regarding the information given to you following your procedure. If we do not reach you, we will leave a message.  However, if you are feeling well and you are not experiencing any problems, there is no need to return our call.  We will assume that you have returned to your regular daily activities without incident.  If any biopsies were taken you will be contacted by phone or by letter within the next 1-3 weeks.  Please call us at [phone] if you have not heard about the biopsies in 3 weeks.    SIGNATURES/CONFIDENTIALITY: You and/or your care partner have signed paperwork which will be entered into your electronic medical record.  These signatures attest to the fact that that the information above on your After Visit Summary has been reviewed and is understood.  Full responsibility of the confidentiality of this discharge information lies with you and/or your care-partner. 

## 2018-08-27 NOTE — Op Note (Signed)
[facility] Patient Name: Aaron Haynes Procedure Date: 08/27/2018 2:52 PM MRN: [id number] Endoscopist: Tressia Danas MD, MD Age: 23 Referring MD:  Date of Birth: [birth year] Gender: Male Account #: [id number] Procedure:                Upper GI endoscopy Indications:              Dysphagia, GERD Medicines:                See the Anesthesia note for documentation of the                            administered medications Procedure:                Pre-Anesthesia Assessment:                           - Prior to the procedure, a History and Physical                            was performed, and patient medications and                            allergies were reviewed. The patient's tolerance of                            previous anesthesia was also reviewed. The risks                            and benefits of the procedure and the sedation                            options and risks were discussed with the patient.                            All questions were answered, and informed consent                            was obtained. Prior Anticoagulants: The patient has                            taken no previous anticoagulant or antiplatelet                            agents. ASA Grade Assessment: I - A normal, healthy                            patient. After reviewing the risks and benefits,                            the patient was deemed in satisfactory condition to                            undergo the procedure.  After obtaining informed consent, the endoscope was                            passed under direct vision. Throughout the                            procedure, the patient's blood pressure, pulse, and                            oxygen saturations were monitored continuously. The                            Model GIF-HQ190 [id number]) scope was introduced                            through the mouth, and advanced to the third part                             of duodenum. The upper GI endoscopy was                            accomplished without difficulty. The patient                            tolerated the procedure well. Scope In: Scope Out: Findings:                 No endoscopic abnormality was evident in the                            esophagus to explain the patient's complaint of                            dysphagia. Biopsies were obtained from the proximal                            and distal esophagus with cold forceps for                            histology of suspected eosinophilic esophagitis.                           The entire examined stomach was normal. Biopsies                            were taken with a cold forceps for histology.                           The examined duodenum was normal.                           The cardia and gastric fundus were normal on                            retroflexion.  The exam was otherwise without abnormality. Complications:            No immediate complications. Estimated blood loss:                            Minimal. Estimated Blood Loss:     Estimated blood loss was minimal. Impression:               - No endoscopic esophageal abnormality to explain                            patient's dysphagia. Biopsied to exclude                            eosinophilic esophagitis.                           - Normal stomach. Biopsied.                           - Normal examined duodenum.                           - The examination was otherwise normal. Recommendation:           - Patient has a contact number available for                            emergencies. The signs and symptoms of potential                            delayed complications were discussed with the                            patient. Return to normal activities tomorrow.                            Written discharge instructions were provided to the                             patient.                           - Resume regular diet.                           - Continue present medications. Take omeprazole 40                            mg daily for at least 8 weeks.                           - Await pathology results.                           - No repeat upper endoscopy planned at this time. Tressia Danas MD, MD 08/27/2018 3:20:58 PM This report has been signed electronically.

## 2018-08-28 ENCOUNTER — Telehealth: Payer: Self-pay

## 2018-08-28 NOTE — Telephone Encounter (Signed)
  Follow up Call-  Call back number 08/27/2018  Post procedure Call Back phone  # [phone]  Permission to leave phone message Yes     Patient questions:  Do you have a fever, pain , or abdominal swelling? No. Pain Score  0 *  Have you tolerated food without any problems? Yes.    Have you been able to return to your normal activities? Yes.    Do you have any questions about your discharge instructions: Diet   No. Medications  No. Follow up visit  No.  Do you have questions or concerns about your Care? No.  Actions: * If pain score is 4 or above: No action needed, pain <4.  No problems noted per pt. maw

## 2018-08-28 NOTE — Telephone Encounter (Signed)
NO ANSWER, MESSAGE LEFT FOR PATIENT. 

## 2018-09-18 ENCOUNTER — Other Ambulatory Visit: Payer: Self-pay

## 2018-09-18 MED ORDER — OMEPRAZOLE 40 MG PO CPDR: 40.0000 mg | DELAYED_RELEASE_CAPSULE | Freq: Two times a day (BID) | ORAL | 1 refills | Status: DC

## 2018-10-28 ENCOUNTER — Ambulatory Visit: Payer: 59 | Admitting: Gastroenterology

## 2019-04-05 ENCOUNTER — Encounter: Payer: Self-pay | Admitting: Internal Medicine

## 2019-05-24 ENCOUNTER — Other Ambulatory Visit: Payer: Self-pay

## 2019-05-24 DIAGNOSIS — Z20822 Contact with and (suspected) exposure to covid-19: Secondary | ICD-10-CM

## 2019-05-25 LAB — NOVEL CORONAVIRUS, NAA: SARS-CoV-2, NAA: NOT DETECTED

## 2019-11-03 ENCOUNTER — Other Ambulatory Visit: Payer: Self-pay | Admitting: Emergency Medicine

## 2019-11-03 MED ORDER — OMEPRAZOLE 40 MG PO CPDR: 40.0000 mg | DELAYED_RELEASE_CAPSULE | Freq: Two times a day (BID) | ORAL | 0 refills | Status: DC

## 2020-02-03 ENCOUNTER — Ambulatory Visit: Payer: 59 | Admitting: Internal Medicine

## 2020-02-03 ENCOUNTER — Other Ambulatory Visit: Payer: Self-pay

## 2020-02-03 ENCOUNTER — Encounter: Payer: Self-pay | Admitting: Internal Medicine

## 2020-02-03 VITALS — BP 110/70 | HR 79 | Temp 98.2°F | Ht 69.0 in | Wt 157.0 lb

## 2020-02-03 DIAGNOSIS — K219 Gastro-esophageal reflux disease without esophagitis: Secondary | ICD-10-CM | POA: Diagnosis not present

## 2020-02-03 DIAGNOSIS — R1310 Dysphagia, unspecified: Secondary | ICD-10-CM | POA: Diagnosis not present

## 2020-02-03 DIAGNOSIS — R110 Nausea: Secondary | ICD-10-CM

## 2020-02-03 DIAGNOSIS — K589 Irritable bowel syndrome without diarrhea: Secondary | ICD-10-CM

## 2020-02-03 MED ORDER — NITROGLYCERIN 0.4 MG SL SUBL: 0.4000 mg | SUBLINGUAL_TABLET | SUBLINGUAL | 3 refills | Status: DC | PRN

## 2020-02-03 MED ORDER — OMEPRAZOLE 40 MG PO CPDR: 40.0000 mg | DELAYED_RELEASE_CAPSULE | Freq: Two times a day (BID) | ORAL | 2 refills | Status: DC

## 2020-02-03 NOTE — Progress Notes (Signed)
Subjective:    Patient ID: Aaron Haynes, male    DOB: 1995-10-03, 24 y.o.   MRN: [id number]  HPI  Here with long hx of reflux, has seen GI with EGD last yr Dr Tarri Glenn with related dysphagia that was normal, then sent to Allergy for testing and only few allergies to mold and pollen, and not felt to cause problem with esophagus.  Conts to have ongoing symptoms of reflux that is controlled pretty much with med but dysphagia still persisting, mild.  Most recently with getting worse again will have solid food get stuck, severe pain, and unable to pass, where he had to go to [facility] to vomit. Admits to not always taking the PM dose of omeprazole 40 mg. No wt loss, No fever, aspiration.  Pt denies other chest pain, increased sob or doe, wheezing, orthopnea, PND, increased LE swelling, palpitations, dizziness or syncope. Pt denies new neurological symptoms such as new headache, or facial or extremity weakness or numbness   Pt denies polydipsia, polyuria Past Medical History:  Diagnosis Date  . GERD (gastroesophageal reflux disease)    Past Surgical History:  Procedure Laterality Date  . WISDOM TOOTH EXTRACTION      reports that he has never smoked. He has never used smokeless tobacco. He reports that he does not drink alcohol and does not use drugs. family history includes Diabetes in his maternal grandmother; Heart disease in his maternal grandfather, maternal grandmother, and paternal grandfather; Hyperlipidemia in his father; Hypertension in his father; Irritable bowel syndrome in his mother; Ulcerative colitis in his sister. No Known Allergies Current Outpatient Medications on File Prior to Visit  Medication Sig Dispense Refill  . calcium carbonate (TUMS) 500 MG chewable tablet Chew 1 tablet (200 mg of elemental calcium total) by mouth daily as needed for indigestion or heartburn.    Marland Kitchen COVID-19 Specimen Collection KIT See admin instructions.     No current facility-administered medications on file  prior to visit.   Review of Systems All otherwise neg per pt    Objective:   Physical Exam BP 110/70 (BP Location: Left Arm, Patient Position: Sitting, Cuff Size: Large)   Pulse 79   Temp 98.2 F (36.8 C) (Oral)   Ht '5\' 9"'$  (1.753 m)   Wt 157 lb (71.2 kg)   SpO2 97%   BMI 23.18 kg/m  VS noted,  Constitutional: Pt appears in NAD HENT: Head: NCAT.  Right Ear: External ear normal.  Left Ear: External ear normal.  Eyes: . Pupils are equal, round, and reactive to light. Conjunctivae and EOM are normal Nose: without d/c or deformity Neck: Neck supple. Gross normal ROM Cardiovascular: Normal rate and regular rhythm.   Pulmonary/Chest: Effort normal and breath sounds without rales or wheezing.  Abd:  Soft, NT, ND, + BS, no organomegaly Neurological: Pt is alert. At baseline orientation, motor grossly intact Skin: Skin is warm. No rashes, other new lesions, no LE edema Psychiatric: Pt behavior is normal without agitation  All otherwise neg per pt Lab Results  Component Value Date   WBC 5.0 07/18/2017   HGB 15.7 07/18/2017   HCT 45.4 07/18/2017   PLT 227.0 07/18/2017   GLUCOSE 91 07/18/2017   CHOL 161 07/18/2017   TRIG 75.0 07/18/2017   HDL 43.50 07/18/2017   LDLCALC 103 (H) 07/18/2017   ALT 25 07/18/2017   AST 18 07/18/2017   NA 139 07/18/2017   K 4.7 07/18/2017   CL 103 07/18/2017   CREATININE 0.95 07/18/2017  BUN 12 07/18/2017   CO2 30 07/18/2017   TSH 2.77 07/18/2017      Assessment & Plan:

## 2020-02-03 NOTE — Patient Instructions (Addendum)
Please take the omeprazole 40 mg twice per day every day  Ok to try the Nitroglycerin for severe esophagus pain if this recurs  Please continue all other medications as before, and refills have been done if requested.  Please have the pharmacy call with any other refills you may need.  Please keep your appointments with your specialists as you may have planned  You will be contacted regarding the referral for: Barium Swallow  We may need to consider further follow up with Dr Olam Idler

## 2020-02-06 ENCOUNTER — Encounter: Payer: Self-pay | Admitting: Internal Medicine

## 2020-02-06 NOTE — Assessment & Plan Note (Signed)
For increased PPI to bid

## 2020-02-06 NOTE — Assessment & Plan Note (Signed)
O/w stable, cont same tx 

## 2020-02-06 NOTE — Assessment & Plan Note (Signed)
Ok for antiemetic prn

## 2020-02-06 NOTE — Assessment & Plan Note (Addendum)
Ok for barium swallow r/o esophageal dysmotility, for ntg sl prn esophageal pain, and consider f/u with GI  I spent 31 minutes in preparing to see the patient by review of recent labs, imaging and procedures, obtaining and reviewing separately obtained history, communicating with the patient and family or caregiver, ordering medications, tests or procedures, and documenting clinical information in the EHR including the differential Dx, treatment, and any further evaluation and other management of dysphagia, nasuea, gerd, ibs

## 2020-02-16 ENCOUNTER — Other Ambulatory Visit: Payer: 59

## 2020-03-01 ENCOUNTER — Ambulatory Visit
Admission: RE | Admit: 2020-03-01 | Discharge: 2020-03-01 | Disposition: A | Discharge status: Home / Self Care | Payer: 59 | Source: Ambulatory Visit | Attending: Internal Medicine | Admitting: Internal Medicine

## 2020-03-01 ENCOUNTER — Encounter: Payer: Self-pay | Admitting: Internal Medicine

## 2020-03-01 DIAGNOSIS — R110 Nausea: Secondary | ICD-10-CM

## 2020-03-01 DIAGNOSIS — R1310 Dysphagia, unspecified: Secondary | ICD-10-CM

## 2020-03-01 DIAGNOSIS — K219 Gastro-esophageal reflux disease without esophagitis: Secondary | ICD-10-CM

## 2020-06-20 ENCOUNTER — Telehealth (INDEPENDENT_AMBULATORY_CARE_PROVIDER_SITE_OTHER): Payer: 59 | Admitting: Internal Medicine

## 2020-06-20 ENCOUNTER — Encounter: Payer: Self-pay | Admitting: Internal Medicine

## 2020-06-20 ENCOUNTER — Other Ambulatory Visit: Payer: Self-pay

## 2020-06-20 DIAGNOSIS — J069 Acute upper respiratory infection, unspecified: Secondary | ICD-10-CM | POA: Insufficient documentation

## 2020-06-20 MED ORDER — AZITHROMYCIN 250 MG PO TABS: ORAL_TABLET | ORAL | 0 refills | Status: DC

## 2020-06-20 NOTE — Assessment & Plan Note (Signed)
Acute Worsening chest symptoms with productive cough and low grade fevers Concern for bacterial cause, low likelihood of covid Start zpak Continue otc cold medications Rest, fluids Call if no improvement

## 2020-06-20 NOTE — Progress Notes (Signed)
Virtual Visit via Video Note  I connected with Aaron Haynes on 06/20/20 at  9:30 AM EST by a video enabled telemedicine application and verified that I am speaking with the correct person using two identifiers.   I discussed the limitations of evaluation and management by telemedicine and the availability of in person appointments. The patient expressed understanding and agreed to proceed.  Present for the visit:  Myself, Dr Cheryll Cockayne, Aaron Haynes.  The patient is currently at home and I am in the office.    No referring provider.    History of Present Illness: He is here for an acute visit for cold symptoms.  His symptoms started one week ago.  He is experiencing ST, nasal congestion, mild sinus pressure, low grade fever, chills, cough that is productive and headaches.  His cough is worse in morning and evening.  His chest symptoms are getting worse  He has tried taking claritin, mucinex   He had covid in august.    Review of Systems  Constitutional: Positive for fever (low grade). Negative for chills.  HENT: Positive for congestion and sinus pain (mild). Negative for ear pain. Sore throat: better.        Hoarseness  Respiratory: Positive for cough and sputum production. Negative for shortness of breath and wheezing.   Gastrointestinal: Negative for diarrhea and nausea.  Musculoskeletal: Negative for myalgias.  Neurological: Positive for headaches. Negative for dizziness.      Social History   Socioeconomic History  . Marital status: Married    Spouse name: Not on file  . Number of children: 0  . Years of education: Not on file  . Highest education level: Not on file  Occupational History  . Occupation: church  Tobacco Use  . Smoking status: Never Smoker  . Smokeless tobacco: Never Used  Vaping Use  . Vaping Use: Never used  Substance and Sexual Activity  . Alcohol use: No  . Drug use: No  . Sexual activity: Not on file  Other Topics Concern  . Not on file   Social History Narrative   College   Part time bank teller   Exercises 2 times a week on average   Social Determinants of Health   Financial Resource Strain:   . Difficulty of Paying Living Expenses: Not on file  Food Insecurity:   . Worried About Programme researcher, broadcasting/film/video in the Last Year: Not on file  . Ran Out of Food in the Last Year: Not on file  Transportation Needs:   . Lack of Transportation (Medical): Not on file  . Lack of Transportation (Non-Medical): Not on file  Physical Activity:   . Days of Exercise per Week: Not on file  . Minutes of Exercise per Session: Not on file  Stress:   . Feeling of Stress : Not on file  Social Connections:   . Frequency of Communication with Friends and Family: Not on file  . Frequency of Social Gatherings with Friends and Family: Not on file  . Attends Religious Services: Not on file  . Active Member of Clubs or Organizations: Not on file  . Attends Banker Meetings: Not on file  . Marital Status: Not on file     Observations/Objective: Appears well in NAD Sounds hoarse and congested Breathing normal Skin appears warm and dry  Assessment and Plan:  See Problem List for Assessment and Plan of chronic medical problems.   Follow Up Instructions:    I discussed the  assessment and treatment plan with the patient. The patient was provided an opportunity to ask questions and all were answered. The patient agreed with the plan and demonstrated an understanding of the instructions.   The patient was advised to call back or seek an in-person evaluation if the symptoms worsen or if the condition fails to improve as anticipated.    Binnie Rail, MD

## 2021-04-18 IMAGING — RF DG ESOPHAGUS
4 series · 15 of 22 positions shown · non-contrast
Comparison: None.

CLINICAL DATA: Recurrent dysphagia with vomiting.

EXAM:
ESOPHOGRAM / BARIUM SWALLOW / BARIUM TABLET STUDY
TECHNIQUE: Combined double contrast and single contrast examination performed
using effervescent crystals, thick barium liquid, and thin barium
liquid. The patient was observed with fluoroscopy swallowing a 13 mm
barium sulphate tablet.
FLUOROSCOPY TIME:  Fluoroscopy Time:  1 minutes 0 seconds
Radiation Exposure Index (if provided by the fluoroscopic device):
75 mGy
Number of Acquired Spot Images: 8

[Series 1: sequence · 0.31mm/px · 2 of 4 frames shown (1 of 2)]
[frame 1/4]
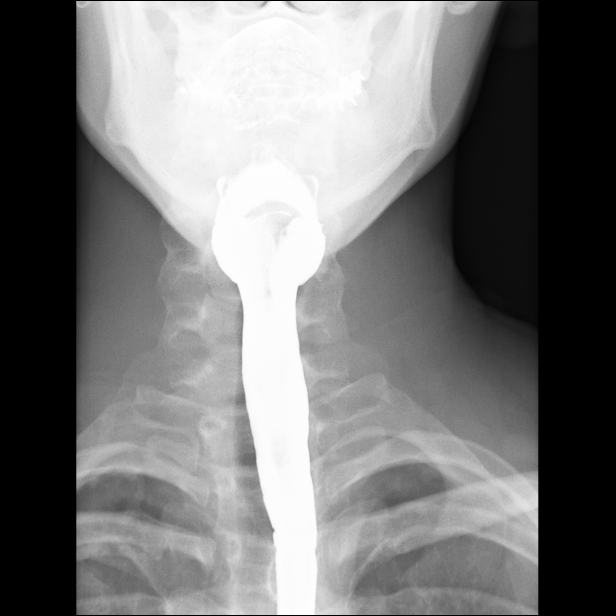
[frame 4/4]
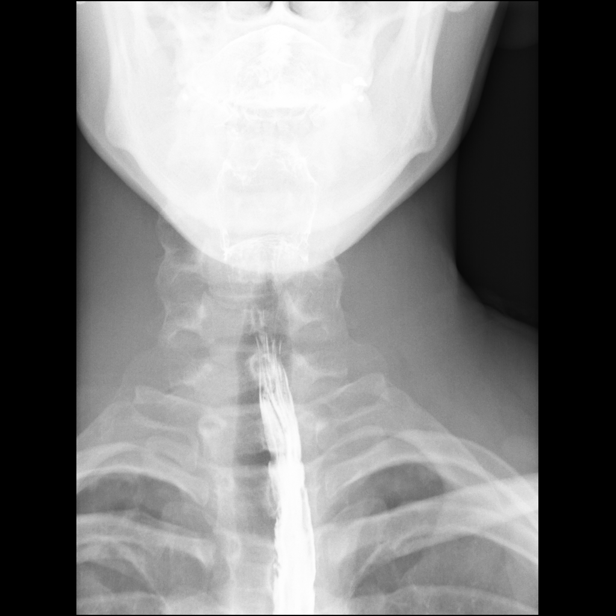

[Series 2: one shot · 1 of 1 slices shown (1 of 2)]
[im 1/1]
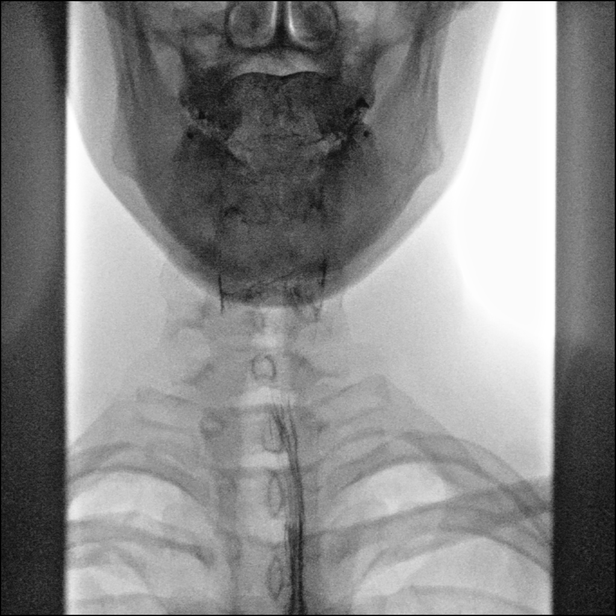

[Series 3: sequence · 0.31mm/px · 2 of 4 frames shown (2 of 2)]
[frame 3/4]
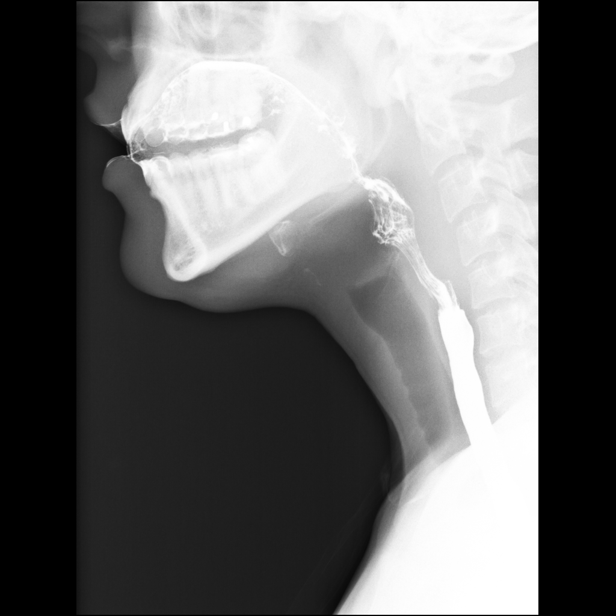
[frame 4/4]
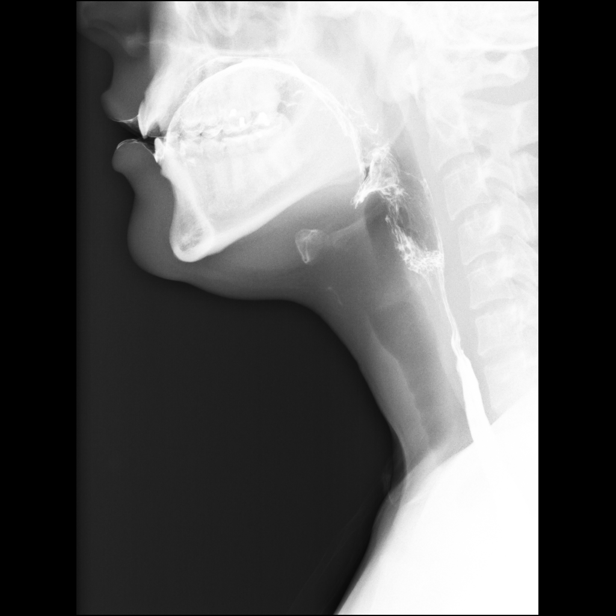

[Series 4: one shot · 10 of 15 slices shown (2 of 2)]
[im 2/15]
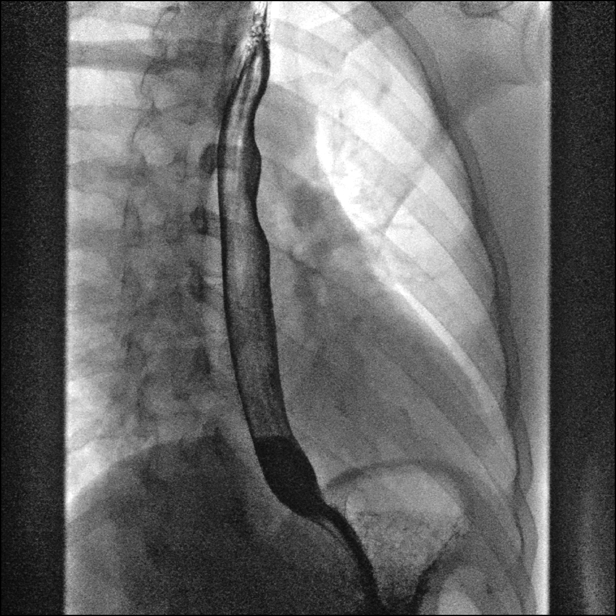
[im 3/15]
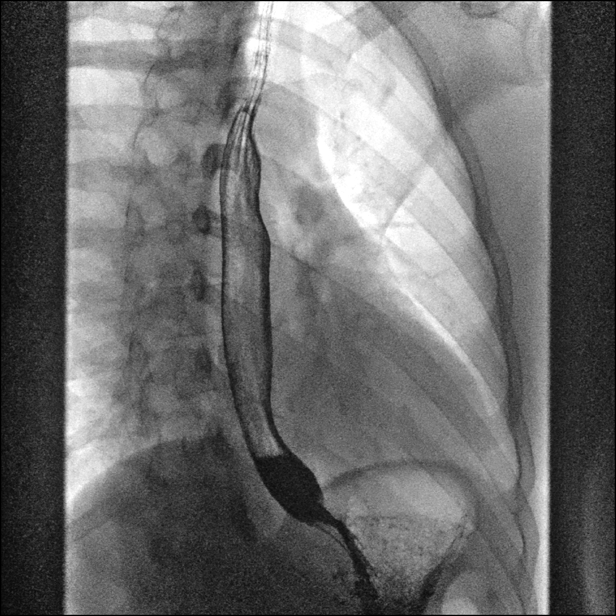
[im 5/15]
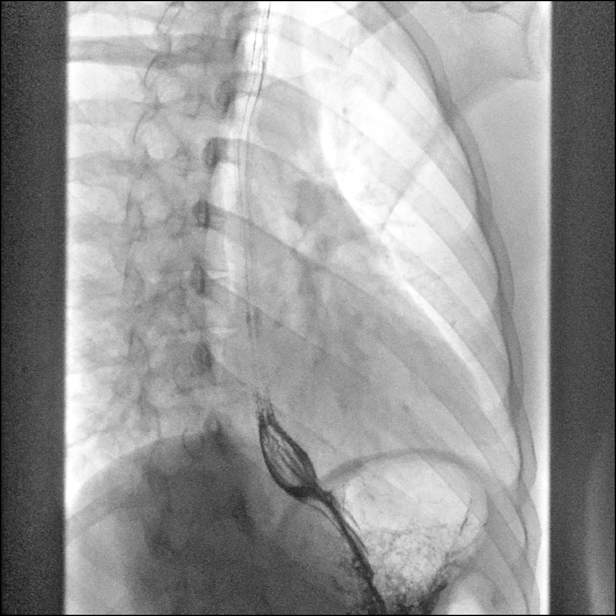
[im 6/15]
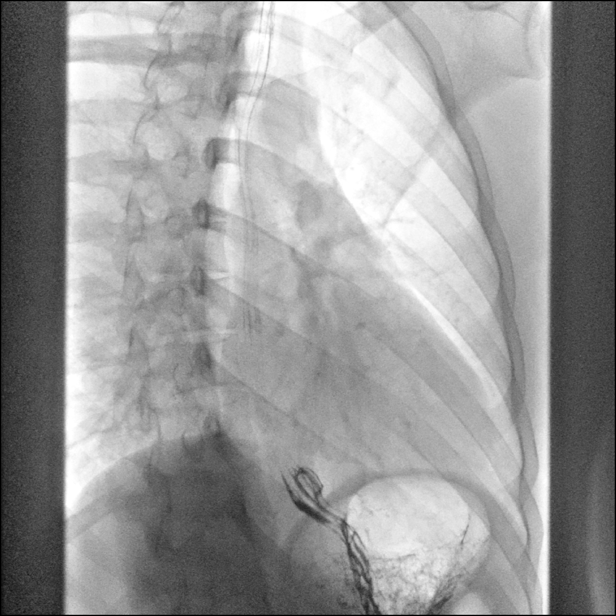
[im 7/15]
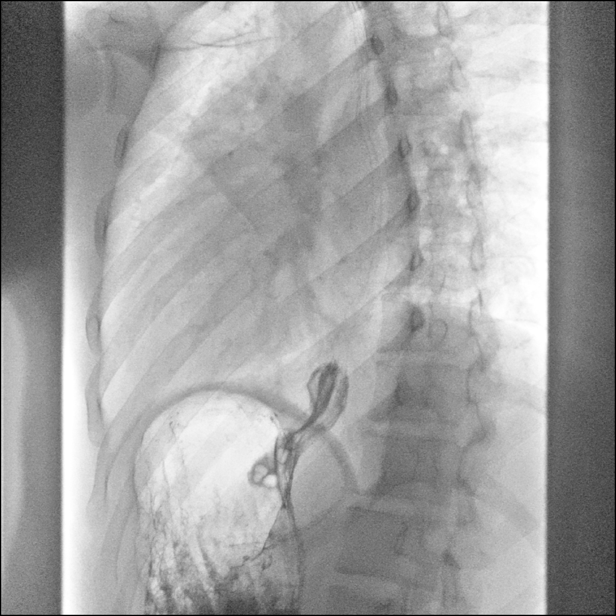
[im 9/15]
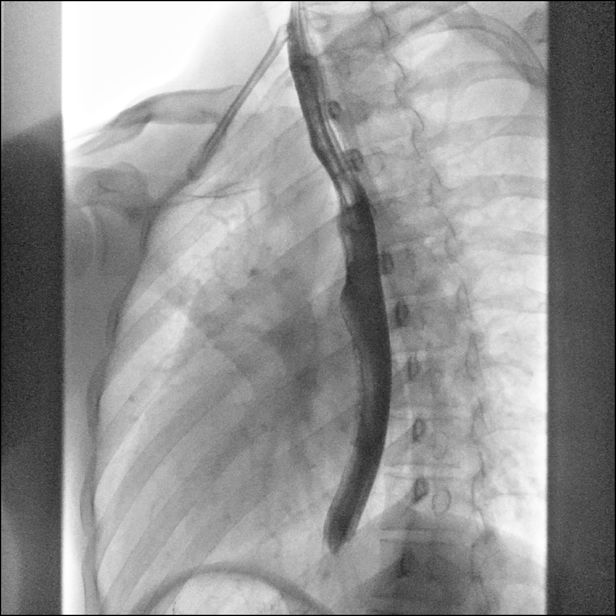
[im 10/15]
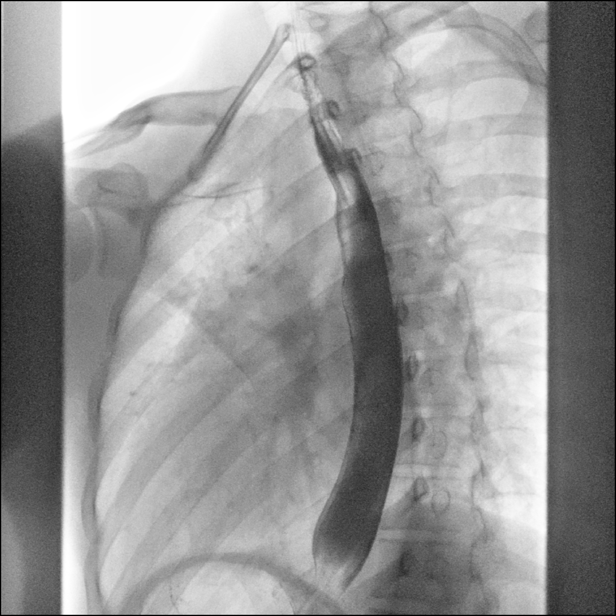
[im 12/15]
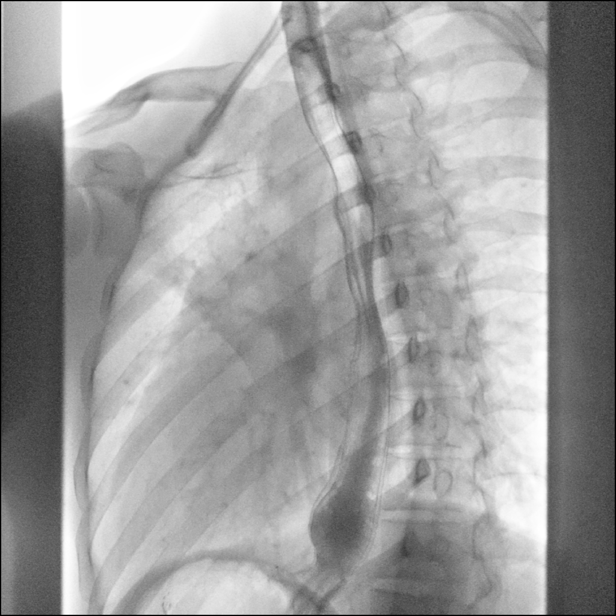
[im 13/15]
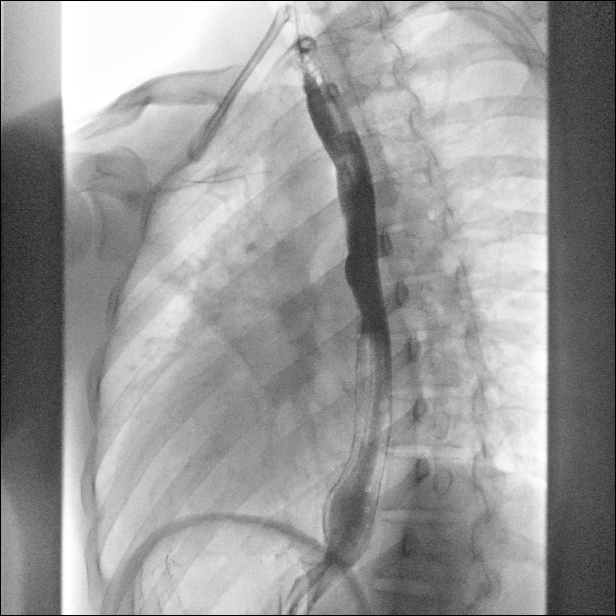
[im 15/15]
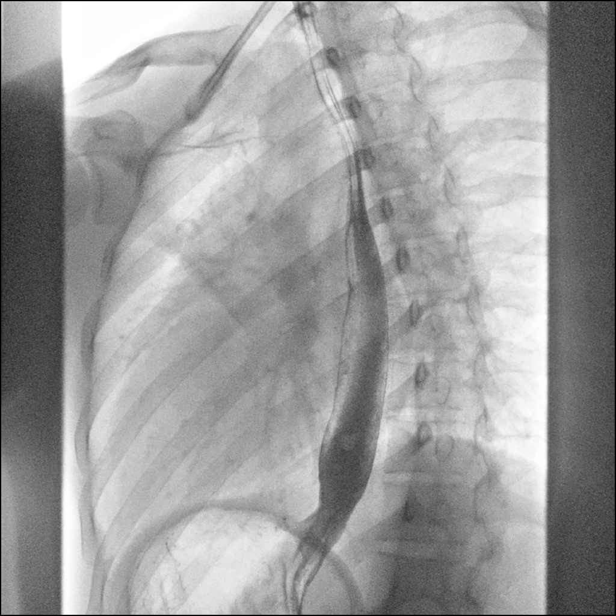

[15 of 22 positions shown; findings below may reference images not displayed]

FINDINGS: Swallowing mechanism is unremarkable. No esophageal fold thickening,
stricture or obstruction. Normal esophageal motility. Tiny hiatal
hernia.
IMPRESSION: Tiny hiatal hernia.

## 2021-04-25 ENCOUNTER — Other Ambulatory Visit: Payer: Self-pay | Admitting: Internal Medicine

## 2021-05-27 NOTE — Progress Notes (Signed)
Virtual Visit via Video Note  I connected with Aaron Haynes on 05/27/21 at  9:10 AM EST by a video enabled telemedicine application and verified that I am speaking with the correct person using two identifiers.   I discussed the limitations of evaluation and management by telemedicine and the availability of in person appointments. The patient expressed understanding and agreed to proceed.  Present for the visit:  Myself, Dr Cheryll Cockayne, Aaron Haynes.  The patient is currently at home and I am in the office.    No referring provider.    History of Present Illness: This is an acute visit for GERD  Has had GERD most of his life.  He had an endoscopy in 2020 that was normal.  He has been taking omeprazole for about 2 years.  He does control his symptoms.  If he takes it regularly he has no issues.  Coffee or most foods can set it off.  He tries to be careful with his diet.  He did run out of his prescription dose, which is 40 mg and he admits he is not always taking it on a daily basis and often will take it every other day or every few days and it would still work well.  Since he has been out he has had increased symptoms.  He has been using over-the-counter version and it does help.  He was concerned about taking medication long-term use of potential side effects that he heard about.  At 1 point he was experiencing dysphagia, but he denies any dysphagia for a while.  He still thinks he needs medication.   Review of Systems  Gastrointestinal:  Positive for heartburn. Negative for abdominal pain, blood in stool, melena and nausea.     Social History   Socioeconomic History   Marital status: Married    Spouse name: Not on file   Number of children: 0   Years of education: Not on file   Highest education level: Not on file  Occupational History   Occupation: church  Tobacco Use   Smoking status: Never   Smokeless tobacco: Never  Vaping Use   Vaping Use: Never used  Substance and  Sexual Activity   Alcohol use: No   Drug use: No   Sexual activity: Not on file  Other Topics Concern   Not on file  Social History Narrative   College   Part time bank teller   Exercises 2 times a week on average   Social Determinants of Health   Financial Resource Strain: Not on file  Food Insecurity: Not on file  Transportation Needs: Not on file  Physical Activity: Not on file  Stress: Not on file  Social Connections: Not on file     Observations/Objective: Appears well in NAD Breathing normally Skin appears warm and dry Mood is normal  Assessment and Plan:  See Problem List for Assessment and Plan of chronic medical problems.   Follow Up Instructions:    I discussed the assessment and treatment plan with the patient. The patient was provided an opportunity to ask questions and all were answered. The patient agreed with the plan and demonstrated an understanding of the instructions.   The patient was advised to call back or seek an in-person evaluation if the symptoms worsen or if the condition fails to improve as anticipated.    Pincus Sanes, MD

## 2021-05-29 ENCOUNTER — Telehealth (INDEPENDENT_AMBULATORY_CARE_PROVIDER_SITE_OTHER): Payer: 59 | Admitting: Internal Medicine

## 2021-05-29 ENCOUNTER — Encounter: Payer: Self-pay | Admitting: Internal Medicine

## 2021-05-29 DIAGNOSIS — K219 Gastro-esophageal reflux disease without esophagitis: Secondary | ICD-10-CM

## 2021-05-29 MED ORDER — OMEPRAZOLE 20 MG PO CPDR: 20.0000 mg | DELAYED_RELEASE_CAPSULE | Freq: Every day | ORAL | 3 refills | Status: DC

## 2021-05-29 NOTE — Assessment & Plan Note (Addendum)
Chronic Has had GERD for a while and medication does control the symptoms EGD 2020-normal Discussed potential side effects of long-term use of PPIs He currently is not taking anything and is having symptoms Will start omeprazole 20 mg daily-discussed that the most important thing is to keep his symptoms controlled, but if well controlled on 20 mg daily he can try taking this every other day-discussed that the least amount that he takes at the better Goal would be to get to a point where he is taking it not on a daily basis, but only intermittently as needed GERD diet Advised him to let us know if he has questions or concerns

## 2022-02-17 ENCOUNTER — Encounter: Payer: Self-pay | Admitting: Internal Medicine

## 2022-02-17 NOTE — Progress Notes (Signed)
Subjective:    Patient ID: Aaron Haynes, male    DOB: [birth year], 26 y.o.   MRN: [id number]     HPI Jakhi is here for a physical exam.   Doing well.    Left knee and foot issues.  Some days he has difficulty walking w/o a shoe.  The pain is just proximal to the ball of his foot.  Some days - no pain and some days he is limping.  Left knee is sometimes sore.    Medications and allergies reviewed with patient and updated if appropriate.  Current Outpatient Medications on File Prior to Visit  Medication Sig Dispense Refill   calcium carbonate (TUMS) 500 MG chewable tablet Chew 1 tablet (200 mg of elemental calcium total) by mouth daily as needed for indigestion or heartburn.     No current facility-administered medications on file prior to visit.    Review of Systems  Constitutional:  Negative for fever.  Eyes:  Negative for visual disturbance.  Respiratory:  Negative for cough, shortness of breath and wheezing.   Cardiovascular:  Negative for chest pain, palpitations and leg swelling.  Gastrointestinal:  Negative for abdominal pain, blood in stool, constipation, diarrhea and nausea.  Genitourinary:  Negative for dysuria and hematuria.  Musculoskeletal:  Positive for arthralgias (left knee, left foot) and back pain (when he wakes up - resolves quickly - some days).  Skin:  Negative for rash.  Neurological:  Negative for light-headedness and headaches.  Psychiatric/Behavioral:  Negative for dysphoric mood. The patient is not nervous/anxious.        Objective:   Vitals:   02/19/22 0747  BP: 108/80  Pulse: 94  Temp: 98 F (36.7 C)  SpO2: 97%   Filed Weights   02/19/22 0747  Weight: 153 lb (69.4 kg)   Body mass index is 22.59 kg/m.  BP Readings from Last 3 Encounters:  02/19/22 108/80  02/03/20 110/70  08/27/18 120/88    Wt Readings from Last 3 Encounters:  02/19/22 153 lb (69.4 kg)  02/03/20 157 lb (71.2 kg)  08/27/18 142 lb (64.4 kg)      Physical  Exam Constitutional: He appears well-developed and well-nourished. No distress.  HENT:  Head: Normocephalic and atraumatic.  Right Ear: External ear normal.  Left Ear: External ear normal.  Mouth/Throat: Oropharynx is clear and moist.  Normal ear canals and TM b/l  Eyes: Conjunctivae and EOM are normal.  Neck: Neck supple. No tracheal deviation present. No thyromegaly present.  No carotid bruit  Cardiovascular: Normal rate, regular rhythm, normal heart sounds and intact distal pulses.   No murmur heard. Pulmonary/Chest: Effort normal and breath sounds normal. No respiratory distress. He has no wheezes. He has no rales.  Abdominal: Soft. He exhibits no distension. There is no tenderness.  Genitourinary: deferred  Musculoskeletal: He exhibits no edema.  Lymphadenopathy:   He has no cervical adenopathy.  Skin: Skin is warm and dry. He is not diaphoretic.  Psychiatric: He has a normal mood and affect. His behavior is normal.         Assessment & Plan:   Physical exam: Screening blood work  ordered Exercise   regular -- walking Weight normal Substance abuse   none  Referral for dermatology - skin cancer screening   Reviewed recommended immunizations.  Discussed HPV vaccine   Health Maintenance  Topic Date Due   Hepatitis C Screening  Never done   INFLUENZA VACCINE  02/12/2022   COVID-19 Vaccine (1) 03/07/2022 (Originally  01/03/1997)   HPV VACCINES (1 - Risk male 3-dose series) 02/20/2023 (Originally 07/06/2007)   TETANUS/TDAP  07/16/2023   HIV Screening  Completed     See Problem List for Assessment and Plan of chronic medical problems.

## 2022-02-17 NOTE — Patient Instructions (Addendum)
Blood work was ordered.     Medications changes include :   None   Your prescription(s) have been sent to your pharmacy.     Return in about 1 year (around 02/20/2023) for Physical Exam.   Health Maintenance, Male Adopting a healthy lifestyle and getting preventive care are important in promoting health and wellness. Ask your health care provider about: The right schedule for you to have regular tests and exams. Things you can do on your own to prevent diseases and keep yourself healthy. What should I know about diet, weight, and exercise? Eat a healthy diet  Eat a diet that includes plenty of vegetables, fruits, low-fat dairy products, and lean protein. Do not eat a lot of foods that are high in solid fats, added sugars, or sodium. Maintain a healthy weight Body mass index (BMI) is a measurement that can be used to identify possible weight problems. It estimates body fat based on height and weight. Your health care provider can help determine your BMI and help you achieve or maintain a healthy weight. Get regular exercise Get regular exercise. This is one of the most important things you can do for your health. Most adults should: Exercise for at least 150 minutes each week. The exercise should increase your heart rate and make you sweat (moderate-intensity exercise). Do strengthening exercises at least twice a week. This is in addition to the moderate-intensity exercise. Spend less time sitting. Even light physical activity can be beneficial. Watch cholesterol and blood lipids Have your blood tested for lipids and cholesterol at 26 years of age, then have this test every 5 years. You may need to have your cholesterol levels checked more often if: Your lipid or cholesterol levels are high. You are older than 26 years of age. You are at high risk for heart disease. What should I know about cancer screening? Many types of cancers can be detected early and may often be  prevented. Depending on your health history and family history, you may need to have cancer screening at various ages. This may include screening for: Colorectal cancer. Prostate cancer. Skin cancer. Lung cancer. What should I know about heart disease, diabetes, and high blood pressure? Blood pressure and heart disease High blood pressure causes heart disease and increases the risk of stroke. This is more likely to develop in people who have high blood pressure readings or are overweight. Talk with your health care provider about your target blood pressure readings. Have your blood pressure checked: Every 3-5 years if you are 73-88 years of age. Every year if you are 18 years old or older. If you are between the ages of 22 and 65 and are a current or former smoker, ask your health care provider if you should have a one-time screening for abdominal aortic aneurysm (AAA). Diabetes Have regular diabetes screenings. This checks your fasting blood sugar level. Have the screening done: Once every three years after age 82 if you are at a normal weight and have a low risk for diabetes. More often and at a younger age if you are overweight or have a high risk for diabetes. What should I know about preventing infection? Hepatitis B If you have a higher risk for hepatitis B, you should be screened for this virus. Talk with your health care provider to find out if you are at risk for hepatitis B infection. Hepatitis C Blood testing is recommended for: Everyone born from [birth year] through 1965. Anyone with known  risk factors for hepatitis C. Sexually transmitted infections (STIs) You should be screened each year for STIs, including gonorrhea and chlamydia, if: You are sexually active and are younger than 26 years of age. You are older than 27 years of age and your health care provider tells you that you are at risk for this type of infection. Your sexual activity has changed since you were last screened,  and you are at increased risk for chlamydia or gonorrhea. Ask your health care provider if you are at risk. Ask your health care provider about whether you are at high risk for HIV. Your health care provider may recommend a prescription medicine to help prevent HIV infection. If you choose to take medicine to prevent HIV, you should first get tested for HIV. You should then be tested every 3 months for as long as you are taking the medicine. Follow these instructions at home: Alcohol use Do not drink alcohol if your health care provider tells you not to drink. If you drink alcohol: Limit how much you have to 0-2 drinks a day. Know how much alcohol is in your drink. In the [place] one drink equals one 12 oz bottle of beer (355 mL), one 5 oz glass of wine (148 mL), or one 1 oz glass of hard liquor (44 mL). Lifestyle Do not use any products that contain nicotine or tobacco. These products include cigarettes, chewing tobacco, and vaping devices, such as e-cigarettes. If you need help quitting, ask your health care provider. Do not use street drugs. Do not share needles. Ask your health care provider for help if you need support or information about quitting drugs. General instructions Schedule regular health, dental, and eye exams. Stay current with your vaccines. Tell your health care provider if: You often feel depressed. You have ever been abused or do not feel safe at home. Summary Adopting a healthy lifestyle and getting preventive care are important in promoting health and wellness. Follow your health care provider's instructions about healthy diet, exercising, and getting tested or screened for diseases. Follow your health care provider's instructions on monitoring your cholesterol and blood pressure. This information is not intended to replace advice given to you by your health care provider. Make sure you discuss any questions you have with your health care provider. Document Revised:  11/20/2020 Document Reviewed: 11/20/2020 Elsevier Patient Education  [place].

## 2022-02-19 ENCOUNTER — Ambulatory Visit: Payer: 59 | Admitting: Internal Medicine

## 2022-02-19 VITALS — BP 108/80 | HR 94 | Temp 98.0°F | Ht 69.0 in | Wt 153.0 lb

## 2022-02-19 DIAGNOSIS — G8929 Other chronic pain: Secondary | ICD-10-CM

## 2022-02-19 DIAGNOSIS — M25562 Pain in left knee: Secondary | ICD-10-CM | POA: Diagnosis not present

## 2022-02-19 DIAGNOSIS — Z1159 Encounter for screening for other viral diseases: Secondary | ICD-10-CM

## 2022-02-19 DIAGNOSIS — M79672 Pain in left foot: Secondary | ICD-10-CM

## 2022-02-19 DIAGNOSIS — K219 Gastro-esophageal reflux disease without esophagitis: Secondary | ICD-10-CM | POA: Diagnosis not present

## 2022-02-19 DIAGNOSIS — Z0000 Encounter for general adult medical examination without abnormal findings: Secondary | ICD-10-CM | POA: Diagnosis not present

## 2022-02-19 DIAGNOSIS — Z1283 Encounter for screening for malignant neoplasm of skin: Secondary | ICD-10-CM

## 2022-02-19 LAB — COMPREHENSIVE METABOLIC PANEL
ALT: 24 U/L (ref 0–53)
AST: 21 U/L (ref 0–37)
Albumin: 5.1 g/dL (ref 3.5–5.2)
Alkaline Phosphatase: 44 U/L (ref 39–117)
BUN: 13 mg/dL (ref 6–23)
CO2: 28 mEq/L (ref 19–32)
Calcium: 9.7 mg/dL (ref 8.4–10.5)
Chloride: 101 mEq/L (ref 96–112)
Creatinine, Ser: 1.1 mg/dL (ref 0.40–1.50)
GFR: 93.22 mL/min (ref >60.00)
Glucose, Bld: 66 mg/dL — ABNORMAL LOW (ref 70–99)
Potassium: 4.1 mEq/L (ref 3.5–5.1)
Sodium: 142 mEq/L (ref 135–145)
Total Bilirubin: 1.3 mg/dL — ABNORMAL HIGH (ref 0.2–1.2)
Total Protein: 7.5 g/dL (ref 6.0–8.3)

## 2022-02-19 LAB — CBC WITH DIFFERENTIAL/PLATELET
Basophils Absolute: 0.1 10*3/uL (ref 0.0–0.1)
Basophils Relative: 1.1 % (ref 0.0–3.0)
Eosinophils Absolute: 0.6 10*3/uL (ref 0.0–0.7)
Eosinophils Relative: 11.1 % — ABNORMAL HIGH (ref 0.0–5.0)
HCT: 48.1 % (ref 39.0–52.0)
Hemoglobin: 16.4 g/dL (ref 13.0–17.0)
Lymphocytes Relative: 28.5 % (ref 12.0–46.0)
Lymphs Abs: 1.4 10*3/uL (ref 0.7–4.0)
MCHC: 34.1 g/dL (ref 30.0–36.0)
MCV: 88.1 fl (ref 78.0–100.0)
Monocytes Absolute: 0.6 10*3/uL (ref 0.1–1.0)
Monocytes Relative: 12.4 % — ABNORMAL HIGH (ref 3.0–12.0)
Neutro Abs: 2.3 10*3/uL (ref 1.4–7.7)
Neutrophils Relative %: 46.9 % (ref 43.0–77.0)
Platelets: 206 10*3/uL (ref 150.0–400.0)
RBC: 5.46 Mil/uL (ref 4.22–5.81)
RDW: 12.9 % (ref 11.5–15.5)
WBC: 5 10*3/uL (ref 4.0–10.5)

## 2022-02-19 LAB — LIPID PANEL
Cholesterol: 169 mg/dL (ref 0–200)
HDL: 43.5 mg/dL (ref >39.00)
LDL Cholesterol: 101 mg/dL — ABNORMAL HIGH (ref 0–99)
NonHDL: 125.36
Total CHOL/HDL Ratio: 4
Triglycerides: 123 mg/dL (ref 0.0–149.0)
VLDL: 24.6 mg/dL (ref 0.0–40.0)

## 2022-02-19 LAB — TSH: TSH: 2.77 u[IU]/mL (ref 0.35–5.50)

## 2022-02-19 MED ORDER — OMEPRAZOLE 20 MG PO CPDR: 20.0000 mg | DELAYED_RELEASE_CAPSULE | Freq: Every day | ORAL | 3 refills | Status: AC | PRN

## 2022-02-19 NOTE — Assessment & Plan Note (Addendum)
Chronic GERD controlled Continue omeprazole 20 mg daily prn, Tums as needed

## 2022-02-19 NOTE — Assessment & Plan Note (Signed)
New Referral to sports med

## 2022-02-19 NOTE — Assessment & Plan Note (Signed)
Chronic Referral to sports med

## 2022-02-20 LAB — HEPATITIS C ANTIBODY: Hepatitis C Ab: NONREACTIVE

## 2022-03-11 ENCOUNTER — Ambulatory Visit: Payer: 59 | Admitting: Family Medicine

## 2022-03-11 ENCOUNTER — Ambulatory Visit: Payer: Self-pay

## 2022-03-11 VITALS — BP 104/80 | HR 64 | Ht 69.0 in | Wt 153.4 lb

## 2022-03-11 DIAGNOSIS — M7742 Metatarsalgia, left foot: Secondary | ICD-10-CM

## 2022-03-11 DIAGNOSIS — M25562 Pain in left knee: Secondary | ICD-10-CM | POA: Diagnosis not present

## 2022-03-11 DIAGNOSIS — G8929 Other chronic pain: Secondary | ICD-10-CM

## 2022-03-11 NOTE — Patient Instructions (Addendum)
Thank you for coming in today.   Plan for quad strengthening for patellofemoral pain syndrome.     Please use Voltaren gel (Generic Diclofenac Gel) up to 4x daily for pain as needed.  This is available over-the-counter as both the name brand Voltaren gel and the generic diclofenac gel.   Try metatarsal pads from [employer]  You can also get insoles with metatarsal pads built in from [employer]. Bring your shoes with you.   Recheck in 6 weeks.   We have more to do if needed.   Patellofemoral Pain Syndrome  Patellofemoral pain syndrome is a condition in which the tissue (cartilage) on the underside of the kneecap (patella) softens or breaks down. This causes pain in the front of the knee. The condition is also called runner's knee or chondromalacia patella. Patellofemoral pain syndrome is most common in young adults who are active in sports. The knee is the largest joint in the body. The patella covers the front of the knee and is attached to muscles above and below the knee. The underside of the patella is covered with a smooth type of cartilage (synovium). The smooth surface helps the patella glide easily when you move your knee. Patellofemoral pain syndrome causes swelling in the joint linings and bone surfaces in the knee. What are the causes? This condition may be caused by: Overuse of the knee. Poor alignment of your knee joints. Weak leg muscles. A direct hit to your kneecap. What increases the risk? You are more likely to develop this condition if: You do a lot of activities that can wear down your kneecap. These include: Running. Squatting. Climbing stairs. You start a new physical activity or exercise program. You wear shoes that do not fit well. You do not have good leg strength. You are overweight. What are the signs or symptoms? The main symptom of this condition is knee pain. This may feel like a dull, aching pain underneath your patella, in the front of your knee. There  may be a popping or cracking sound when you move your knee. Pain may get worse with: Exercise. Climbing stairs. Running. Jumping. Squatting. Kneeling. Sitting for a long time. Moving or pushing on your patella. How is this diagnosed? This condition may be diagnosed based on: Your symptoms and medical history. You may be asked about your recent physical activities and which ones cause knee pain. A physical exam. This may include: Moving your patella back and forth. Checking your range of knee motion. Having you squat or jump to see if you have pain. Checking the strength of your leg muscles. Imaging tests to confirm the diagnosis. These may include an MRI of your knee. How is this treated? This condition may be treated at home with rest, ice, compression, and elevation (RICE).  Other treatments may include: NSAIDs, such as ibuprofen. Physical therapy to stretch and strengthen your leg muscles. Shoe inserts (orthotics) to take stress off your knee. A knee brace or knee support. Adhesive tapes to the skin. Surgery to remove damaged cartilage or move the patella to a better position. This is rare. Follow these instructions at home: If you have a brace: Wear the brace as told by your health care provider. Remove it only as told by your health care provider. Loosen the brace if your toes tingle, become numb, or turn cold and blue. Keep the brace clean. If the brace is not waterproof: Do not let it get wet. Cover it with a watertight covering when you  take a bath or a shower. Managing pain, stiffness, and swelling  If directed, put ice on the painful area. To do this: If you have a removable brace, remove it as told by your health care provider. Put ice in a plastic bag. Place a towel between your skin and the bag. Leave the ice on for 20 minutes, 2-3 times a day. Remove the ice if your skin turns bright red. This is very important. If you cannot feel pain, heat, or cold, you have  a greater risk of damage to the area. Move your toes often to reduce stiffness and swelling. Raise (elevate) the injured area above the level of your heart while you are sitting or lying down. Activity Rest your knee. Avoid activities that cause knee pain. Perform stretching and strengthening exercises as told by your health care provider or physical therapist. Return to your normal activities as told by your health care provider. Ask your health care provider what activities are safe for you. General instructions Take over-the-counter and prescription medicines only as told by your health care provider. Use splints, braces, knee supports, or walking aids as directed by your health care provider. Do not use any products that contain nicotine or tobacco, such as cigarettes, e-cigarettes, and chewing tobacco. These can delay healing. If you need help quitting, ask your health care provider. Keep all follow-up visits. This is important. Contact a health care provider if: Your symptoms get worse. You are not improving with home care. Summary Patellofemoral pain syndrome is a condition in which the tissue (cartilage) on the underside of the kneecap (patella) softens or breaks down. This condition causes swelling in the joint linings and bone surfaces in the knee. This leads to pain in the front of the knee. This condition may be treated at home with rest, ice, compression, and elevation (RICE). Use splints, braces, knee supports, or walking aids as directed by your health care provider. This information is not intended to replace advice given to you by your health care provider. Make sure you discuss any questions you have with your health care provider. Document Revised: 12/15/2019 Document Reviewed: 12/15/2019 Elsevier Patient Education  2023 [employer].

## 2022-03-11 NOTE — Progress Notes (Signed)
   I, Philbert Riser, LAT, ATC acting as a scribe for Clementeen Graham, MD.  Subjective:    CC: L knee and L foot pain  HPI: Pt is a 26 y/o male c/o L knee and L foot pain.  L knee pain that's ongoing since childhood. Pt played sports as a kid. Pt locates pain to the anterior aspect of the L knee.   L Knee swelling: no Mechanical symptoms: yes Aggravates: knee flexed for extended periods, walking Treatments tried: wearing quality shoes  Pt also c/o L foot pain x a few months. Pt locates pain to the plantar aspect of the heads of the MT (apprx 1-3). Pt c/o increased pain when walking barefooted on his hardwood floors.   Pertinent review of Systems: No fevers or chills  Relevant historical information: IBS.  Patient works as a Education officer, environmental.   Objective:    Vitals:   03/11/22 1008  BP: 104/80  Pulse: 64  SpO2: 97%   General: Well Developed, well nourished, and in no acute distress.   MSK: Left knee: Normal-appearing Normal motion. Nontender. Intact strength. Stable ligamentous exam. Negative Murray's test.  Left foot: Normal-appearing Mildly tender palpation plantar second and third metatarsal heads. Normal foot and ankle motion and strength. Pulses capillary fill and sensation are intact distally.    Impression and Recommendations:    Assessment and Plan: 26 y.o. male with left anterior knee pain with intermittent crepitation is consistent with patellofemoral pain syndrome.  Plan to work on quad strengthening exercises.  Ideally would be doing physical therapy but he like to try it on his own first which is reasonable.  If not improving plan for PT.  Certainly could proceed with further work-up including x-ray and ultrasound in the future if needed.  Left plantar forefoot pain thought to be metatarsalgia.  Recommend metatarsal pads and insoles..  Check back in about 6 weeks.   Discussed warning signs or symptoms. Please see discharge instructions. Patient expresses  understanding.   The above documentation has been reviewed and is accurate and complete Clementeen Graham, M.D.

## 2022-04-26 NOTE — Progress Notes (Deleted)
   I, Peterson Lombard, LAT, ATC acting as a scribe for Lynne Leader, MD.  Aaron Haynes is a 26 y.o. male who presents to [place] [facility] today for his 6-week follow-up of chronic left knee pain and metatarsalgia of the left foot.  Patient was last seen by Dr. Georgina Snell on 03/11/2022 and was advised to work on quad strengthening and use metatarsal pads or insoles.  Today, patient reports   Pertinent review of systems: ***  Relevant historical information: ***   Exam:  There were no vitals taken for this visit. General: Well Developed, well nourished, and in no acute distress.   MSK: ***    Lab and Radiology Results No results found for this or any previous visit (from the past 72 hour(s)). No results found.     Assessment and Plan: 26 y.o. male with ***   PDMP not reviewed this encounter. No orders of the defined types were placed in this encounter.  No orders of the defined types were placed in this encounter.    Discussed warning signs or symptoms. Please see discharge instructions. Patient expresses understanding.   ***

## 2022-04-29 ENCOUNTER — Ambulatory Visit: Payer: 59 | Admitting: Family Medicine

## 2022-05-06 ENCOUNTER — Ambulatory Visit: Payer: 59 | Admitting: Family Medicine

## 2022-06-14 DIAGNOSIS — Z419 Encounter for procedure for purposes other than remedying health state, unspecified: Secondary | ICD-10-CM | POA: Diagnosis not present

## 2022-07-15 DIAGNOSIS — Z419 Encounter for procedure for purposes other than remedying health state, unspecified: Secondary | ICD-10-CM | POA: Diagnosis not present

## 2022-08-15 DIAGNOSIS — Z419 Encounter for procedure for purposes other than remedying health state, unspecified: Secondary | ICD-10-CM | POA: Diagnosis not present

## 2022-09-13 DIAGNOSIS — Z419 Encounter for procedure for purposes other than remedying health state, unspecified: Secondary | ICD-10-CM | POA: Diagnosis not present

## 2022-10-14 DIAGNOSIS — Z419 Encounter for procedure for purposes other than remedying health state, unspecified: Secondary | ICD-10-CM | POA: Diagnosis not present

## 2022-11-13 DIAGNOSIS — Z419 Encounter for procedure for purposes other than remedying health state, unspecified: Secondary | ICD-10-CM | POA: Diagnosis not present

## 2022-12-14 DIAGNOSIS — Z419 Encounter for procedure for purposes other than remedying health state, unspecified: Secondary | ICD-10-CM | POA: Diagnosis not present

## 2023-01-13 DIAGNOSIS — Z419 Encounter for procedure for purposes other than remedying health state, unspecified: Secondary | ICD-10-CM | POA: Diagnosis not present

## 2023-02-13 DIAGNOSIS — Z419 Encounter for procedure for purposes other than remedying health state, unspecified: Secondary | ICD-10-CM | POA: Diagnosis not present

## 2023-03-16 DIAGNOSIS — Z419 Encounter for procedure for purposes other than remedying health state, unspecified: Secondary | ICD-10-CM | POA: Diagnosis not present

## 2023-04-15 DIAGNOSIS — Z419 Encounter for procedure for purposes other than remedying health state, unspecified: Secondary | ICD-10-CM | POA: Diagnosis not present
